# Patient Record
Sex: Male | Born: 1970 | Race: White | Hispanic: No | Smoking: Current some day smoker
Health system: Southern US, Community
[De-identification: ages and names within clinical notes are randomized; demographics above are authoritative.]

## PROBLEM LIST (undated history)

## (undated) HISTORY — PX: OTHER SURGICAL HISTORY: SHX169

## (undated) HISTORY — PX: FRACTURE SURGERY: SHX138

---

## 2014-04-24 ENCOUNTER — Encounter (HOSPITAL_BASED_OUTPATIENT_CLINIC_OR_DEPARTMENT_OTHER): Payer: Self-pay | Admitting: *Deleted

## 2014-04-24 ENCOUNTER — Inpatient Hospital Stay (HOSPITAL_BASED_OUTPATIENT_CLINIC_OR_DEPARTMENT_OTHER)
Admission: EM | Admit: 2014-04-24 | Discharge: 2014-04-25 | DRG: 988 | Disposition: A | Discharge status: Home / Self Care | Payer: Self-pay | Attending: Internal Medicine | Admitting: Internal Medicine

## 2014-04-24 ENCOUNTER — Emergency Department (HOSPITAL_BASED_OUTPATIENT_CLINIC_OR_DEPARTMENT_OTHER): Payer: Self-pay

## 2014-04-24 DIAGNOSIS — N493 Fournier gangrene: Secondary | ICD-10-CM

## 2014-04-24 DIAGNOSIS — Z7982 Long term (current) use of aspirin: Secondary | ICD-10-CM

## 2014-04-24 DIAGNOSIS — K859 Acute pancreatitis without necrosis or infection, unspecified: Secondary | ICD-10-CM | POA: Diagnosis present

## 2014-04-24 DIAGNOSIS — F17210 Nicotine dependence, cigarettes, uncomplicated: Secondary | ICD-10-CM | POA: Diagnosis present

## 2014-04-24 DIAGNOSIS — E1165 Type 2 diabetes mellitus with hyperglycemia: Secondary | ICD-10-CM

## 2014-04-24 DIAGNOSIS — Z720 Tobacco use: Secondary | ICD-10-CM

## 2014-04-24 DIAGNOSIS — F17200 Nicotine dependence, unspecified, uncomplicated: Secondary | ICD-10-CM

## 2014-04-24 DIAGNOSIS — N492 Inflammatory disorders of scrotum: Principal | ICD-10-CM | POA: Diagnosis present

## 2014-04-24 DIAGNOSIS — T86898 Other complications of other transplanted tissue: Secondary | ICD-10-CM

## 2014-04-24 DIAGNOSIS — L0291 Cutaneous abscess, unspecified: Secondary | ICD-10-CM | POA: Diagnosis present

## 2014-04-24 DIAGNOSIS — Z6834 Body mass index (BMI) 34.0-34.9, adult: Secondary | ICD-10-CM

## 2014-04-24 DIAGNOSIS — IMO0002 Reserved for concepts with insufficient information to code with codable children: Secondary | ICD-10-CM | POA: Insufficient documentation

## 2014-04-24 DIAGNOSIS — R609 Edema, unspecified: Secondary | ICD-10-CM | POA: Diagnosis present

## 2014-04-24 DIAGNOSIS — E871 Hypo-osmolality and hyponatremia: Secondary | ICD-10-CM

## 2014-04-24 DIAGNOSIS — E119 Type 2 diabetes mellitus without complications: Secondary | ICD-10-CM | POA: Diagnosis present

## 2014-04-24 DIAGNOSIS — D696 Thrombocytopenia, unspecified: Secondary | ICD-10-CM | POA: Insufficient documentation

## 2014-04-24 DIAGNOSIS — Z91018 Allergy to other foods: Secondary | ICD-10-CM

## 2014-04-24 DIAGNOSIS — E869 Volume depletion, unspecified: Secondary | ICD-10-CM | POA: Diagnosis present

## 2014-04-24 DIAGNOSIS — L03315 Cellulitis of perineum: Secondary | ICD-10-CM | POA: Diagnosis present

## 2014-04-24 LAB — URINALYSIS, ROUTINE W REFLEX MICROSCOPIC
Bilirubin Urine: NEGATIVE
Glucose, UA: >1000 mg/dL — AB
Hgb urine dipstick: NEGATIVE
KETONES UR: 15 mg/dL — AB
Leukocytes, UA: NEGATIVE
NITRITE: NEGATIVE
Protein, ur: NEGATIVE mg/dL
Specific Gravity, Urine: 1.018 (ref 1.005–1.030)
UROBILINOGEN UA: 1 mg/dL (ref 0.0–1.0)
pH: 7.5 (ref 5.0–8.0)

## 2014-04-24 LAB — CBC WITH DIFFERENTIAL/PLATELET
BASOS PCT: 0 % (ref 0–1)
Basophils Absolute: 0 10*3/uL (ref 0.0–0.1)
Eosinophils Absolute: 0.3 10*3/uL (ref 0.0–0.7)
Eosinophils Relative: 2 % (ref 0–5)
HCT: 47.5 % (ref 39.0–52.0)
Hemoglobin: 15.8 g/dL (ref 13.0–17.0)
Lymphocytes Relative: 20 % (ref 12–46)
Lymphs Abs: 3 10*3/uL (ref 0.7–4.0)
MCH: 27.5 pg (ref 26.0–34.0)
MCHC: 33.3 g/dL (ref 30.0–36.0)
MCV: 82.6 fL (ref 78.0–100.0)
MONOS PCT: 9 % (ref 3–12)
Monocytes Absolute: 1.2 10*3/uL — ABNORMAL HIGH (ref 0.1–1.0)
Neutro Abs: 10 10*3/uL — ABNORMAL HIGH (ref 1.7–7.7)
Neutrophils Relative %: 69 % (ref 43–77)
Platelets: 142 10*3/uL — ABNORMAL LOW (ref 150–400)
RBC: 5.75 MIL/uL (ref 4.22–5.81)
RDW: 13.9 % (ref 11.5–15.5)
WBC: 14.5 10*3/uL — AB (ref 4.0–10.5)

## 2014-04-24 LAB — GLUCOSE, CAPILLARY
GLUCOSE-CAPILLARY: 165 mg/dL — AB (ref 70–99)
GLUCOSE-CAPILLARY: 187 mg/dL — AB (ref 70–99)
Glucose-Capillary: 168 mg/dL — ABNORMAL HIGH (ref 70–99)

## 2014-04-24 LAB — URINE MICROSCOPIC-ADD ON

## 2014-04-24 LAB — BASIC METABOLIC PANEL
Anion gap: 5 (ref 5–15)
BUN: 11 mg/dL (ref 6–23)
CO2: 25 mmol/L (ref 19–32)
Calcium: 8.1 mg/dL — ABNORMAL LOW (ref 8.4–10.5)
Chloride: 103 mmol/L (ref 96–112)
Creatinine, Ser: 0.76 mg/dL (ref 0.50–1.35)
GFR calc Af Amer: >90 mL/min (ref >90)
GFR calc non Af Amer: >90 mL/min (ref >90)
Glucose, Bld: 253 mg/dL — ABNORMAL HIGH (ref 70–99)
Potassium: 3.9 mmol/L (ref 3.5–5.1)
SODIUM: 133 mmol/L — AB (ref 135–145)

## 2014-04-24 LAB — CBG MONITORING, ED: Glucose-Capillary: 240 mg/dL — ABNORMAL HIGH (ref 70–99)

## 2014-04-24 MED ORDER — INSULIN ASPART PROT & ASPART (70-30 MIX) 100 UNIT/ML ~~LOC~~ SUSP
20.0000 [IU] | Freq: Two times a day (BID) | SUBCUTANEOUS | Status: DC
  Administered 2014-04-24 – 2014-04-25 (×2): 20 [IU] via SUBCUTANEOUS
  Filled 2014-04-24 (×2): qty 10

## 2014-04-24 MED ORDER — INSULIN ASPART 100 UNIT/ML ~~LOC~~ SOLN: 0.0000 [IU] | Freq: Every day | SUBCUTANEOUS | Status: DC

## 2014-04-24 MED ORDER — SODIUM CHLORIDE 0.9 % IV SOLN
Freq: Once | INTRAVENOUS | Status: AC
  Administered 2014-04-24: 10 mL/h via INTRAVENOUS

## 2014-04-24 MED ORDER — METRONIDAZOLE IN NACL 5-0.79 MG/ML-% IV SOLN
500.0000 mg | Freq: Once | INTRAVENOUS | Status: DC
Start: 2014-04-24 — End: 2014-04-24

## 2014-04-24 MED ORDER — TETANUS-DIPHTH-ACELL PERTUSSIS 5-2.5-18.5 LF-MCG/0.5 IM SUSP
0.5000 mL | Freq: Once | INTRAMUSCULAR | Status: AC
  Administered 2014-04-24: 0.5 mL via INTRAMUSCULAR
  Filled 2014-04-24: qty 0.5

## 2014-04-24 MED ORDER — VANCOMYCIN HCL IN DEXTROSE 1-5 GM/200ML-% IV SOLN
1000.0000 mg | Freq: Once | INTRAVENOUS | Status: AC
  Administered 2014-04-24: 1000 mg via INTRAVENOUS
  Filled 2014-04-24: qty 200

## 2014-04-24 MED ORDER — SODIUM CHLORIDE 0.9 % IV BOLUS (SEPSIS)
1000.0000 mL | Freq: Once | INTRAVENOUS | Status: AC
  Administered 2014-04-24: 1000 mL via INTRAVENOUS

## 2014-04-24 MED ORDER — LIDOCAINE HCL 1 % IJ SOLN
INTRAMUSCULAR | Status: AC
  Administered 2014-04-24: 10:00:00 via INTRAMUSCULAR
  Filled 2014-04-24: qty 20

## 2014-04-24 MED ORDER — ENOXAPARIN SODIUM 40 MG/0.4ML ~~LOC~~ SOLN
40.0000 mg | SUBCUTANEOUS | Status: DC
Start: 2014-04-24 — End: 2014-04-25
  Administered 2014-04-24: 40 mg via SUBCUTANEOUS
  Filled 2014-04-24 (×2): qty 0.4

## 2014-04-24 MED ORDER — ACETAMINOPHEN 650 MG RE SUPP
650.0000 mg | Freq: Four times a day (QID) | RECTAL | Status: DC | PRN
Start: 2014-04-24 — End: 2014-04-25

## 2014-04-24 MED ORDER — FENTANYL CITRATE 0.05 MG/ML IJ SOLN
50.0000 ug | Freq: Once | INTRAMUSCULAR | Status: AC
  Administered 2014-04-24: 50 ug via INTRAVENOUS
  Filled 2014-04-24: qty 2

## 2014-04-24 MED ORDER — IOHEXOL 300 MG/ML  SOLN
100.0000 mL | Freq: Once | INTRAMUSCULAR | Status: AC | PRN
  Administered 2014-04-24: 100 mL via INTRAVENOUS

## 2014-04-24 MED ORDER — ACETAMINOPHEN 325 MG PO TABS
650.0000 mg | ORAL_TABLET | Freq: Four times a day (QID) | ORAL | Status: DC | PRN
Start: 2014-04-24 — End: 2014-04-25

## 2014-04-24 MED ORDER — INSULIN ASPART 100 UNIT/ML ~~LOC~~ SOLN
0.0000 [IU] | Freq: Three times a day (TID) | SUBCUTANEOUS | Status: DC
  Administered 2014-04-24 – 2014-04-25 (×2): 3 [IU] via SUBCUTANEOUS

## 2014-04-24 MED ORDER — CLINDAMYCIN PHOSPHATE 600 MG/50ML IV SOLN
600.0000 mg | Freq: Once | INTRAVENOUS | Status: AC
  Administered 2014-04-24: 600 mg via INTRAVENOUS
  Filled 2014-04-24: qty 50

## 2014-04-24 MED ORDER — PIPERACILLIN-TAZOBACTAM 3.375 G IVPB
3.3750 g | Freq: Three times a day (TID) | INTRAVENOUS | Status: DC
  Administered 2014-04-24 – 2014-04-25 (×3): 3.375 g via INTRAVENOUS
  Filled 2014-04-24 (×4): qty 50

## 2014-04-24 MED ORDER — SODIUM CHLORIDE 0.9 % IV SOLN
INTRAVENOUS | Status: DC
  Administered 2014-04-24: 125 mL/h via INTRAVENOUS

## 2014-04-24 MED ORDER — PIPERACILLIN-TAZOBACTAM 3.375 G IVPB 30 MIN: 3.3750 g | Freq: Three times a day (TID) | INTRAVENOUS | Status: DC

## 2014-04-24 MED ORDER — HYDROCODONE-ACETAMINOPHEN 5-325 MG PO TABS
1.0000 | ORAL_TABLET | ORAL | Status: DC | PRN
  Administered 2014-04-25 (×2): 1 via ORAL
  Filled 2014-04-24 (×2): qty 1

## 2014-04-24 MED ORDER — PIPERACILLIN-TAZOBACTAM 3.375 G IVPB 30 MIN
3.3750 g | Freq: Once | INTRAVENOUS | Status: AC
  Administered 2014-04-24: 3.375 g via INTRAVENOUS
  Filled 2014-04-24 (×2): qty 50

## 2014-04-24 MED ORDER — VANCOMYCIN HCL IN DEXTROSE 1-5 GM/200ML-% IV SOLN
1000.0000 mg | Freq: Three times a day (TID) | INTRAVENOUS | Status: DC
Start: 2014-04-24 — End: 2014-04-25
  Administered 2014-04-24 – 2014-04-25 (×3): 1000 mg via INTRAVENOUS
  Filled 2014-04-24 (×4): qty 200

## 2014-04-24 NOTE — ED Notes (Signed)
Dr. Nicanor AlconPalumbo at [facility], chaperone present for exam.

## 2014-04-24 NOTE — Progress Notes (Signed)
Spoke with Dr. Nicanor AlconPalumbo about transfer of pt [facility]  43y/o male with hx of DM2 who claims no MD every told him presented to ED with scrotal swelling and pain with drainage.  Pt had A1C on 09/25/13 of 14.1.  CT of pelvis showed scrotal abscess.  Dr. Nicanor AlconPalumbo already spoke with urology, Dr. Alfredia Fergusonttelein, who will consult and plans to take pt to surgery.  Pt given vanc, zosyn, and clinda in ED.  Vitals stable without hypotension.  Pt accepted for med-surg bed.  DTat

## 2014-04-24 NOTE — ED Notes (Signed)
No changes, pt to CT, alert, NAD, calm.

## 2014-04-24 NOTE — H&P (Signed)
History and Physical  Ronald BrownsDouglas Calabretta ZOX:[id number] DOB: 03/13/1970 DOA: 04/24/2014   PCP: No PCP Per Patient   Chief Complaint: scrotal pain and edema  HPI:  44 year old male with a history of diabetes mellitus presented with 4 day history of increasing pain and edema of his scrotum. The patient states that it started out as a small bump 4 days prior to this admission and has significantly increased in size to the point where he was unable to sleep or move without discomfort. He denies any previous abscess in his perineum although he has had a back abscess in the past. He denied fevers, chills, chest pain, shortness breath, nausea, vomiting, diarrhea. The patient states that he was unaware of a diagnosis of diabetes mellitus. However, review of the medical record in [facility]  shows that he had a hemoglobin A1c of 14.1 on 09/25/2013. The patient has not been on any medications for his diabetes. He is still able to void spontaneously without difficulty.   In the emergency department, CT of the pelvis revealed a 2.2 cm scrotal abscess. Sodium was 133. WBC was 14.5. Urinalysis was negative for pyuria. The patient was afebrile and hemodynamically stable. Urology, Dr. Vernie Ammonsttelin was consulted and drained the abscess. The patient received vancomycin, clindamycin and Zosyn In the emergency department.  Assessment/Plan: Scrotal Abscess -Continue vancomycin and Zosyn  pending culture data  -appreciate Dr. Vernie Ammonsttelin -Follow up culture data from the incision and drainage and adjust abx accordingly -Local wound care per Dr. Vernie Ammonsttelin -HIV antibody Diabetes mellitus type 2  -Recheck hemoglobin A1c  -09/25/2013 hemoglobin A1c 14.1  -As the patient does not have a payor source, start 70/30 insulin--20 units bid -Check CBGs and adjust her 70/30 insulin accordingly  -Check morning lipid panel  -nutrition consult -DM coordinator consult for education Tobacco abuse  -Tobacco cessation discussed  Morbid  obesity -BMI 34.9 Hyponatremia -likely due to volume depletion and pseudo elevation from high CBG -give one liter NS      History reviewed. No pertinent past medical history. Past Surgical History  Procedure Laterality Date  . Arm surgery     Social History:  reports that he has been smoking.  He does not have any smokeless tobacco history on file. He reports that he does not drink alcohol or use illicit drugs.   History reviewed. No pertinent family history.   Allergies  Allergen Reactions  . Asparagus Anaphylaxis  . Broccoli [Brassica Oleracea Italica] Anaphylaxis  . Other Other (See Comments)    Cats-- sneezing, eyes watering.       Prior to Admission medications   Medication Sig Start Date End Date Taking? Authorizing Provider  aspirin-acetaminophen-caffeine (EXCEDRIN MIGRAINE) [phone] MG per tablet Take 2 tablets by mouth every 6 (six) hours as needed for headache.   Yes Historical Provider, MD    Review of Systems:  Constitutional:  No weight loss, night sweats, Fevers, chills, fatigue.  Head&Eyes: No headache.  No vision loss.  No eye pain or scotoma ENT:  No Difficulty swallowing,Tooth/dental problems,Sore throat,   Cardio-vascular:  No chest pain, Orthopnea, PND, swelling in lower extremities,  dizziness, palpitations  GI:  No  abdominal pain, nausea, vomiting, diarrhea, loss of appetite, hematochezia, melena, heartburn, indigestion, Resp:  No shortness of breath with exertion or at rest. No cough. No coughing up of blood .No wheezing.No chest wall deformity  Skin:  no rash or lesions.  GU:  no dysuria, change in color of urine, no urgency or  frequency. No flank pain. Positive scrotal pain and swelling Musculoskeletal:  No joint pain or swelling. No decreased range of motion. No back pain.  Psych:  No change in mood or affect. No depression or anxiety. Neurologic: No headache, no dysesthesia, no focal weakness, no vision loss. No  syncope  Physical Exam: Filed Vitals:   04/24/14 0630 04/24/14 0700 04/24/14 0746 04/24/14 0931  BP: 124/76 137/80 126/75 132/88  Pulse: 78 90 88 92  Temp:    98.6 F (37 C)  TempSrc:    Oral  Resp:   18 18  Height:      Weight:      SpO2: 98% 94% 98% 97%   General:  A&O x 3, NAD, nontoxic, pleasant/cooperative Head/Eye: No conjunctival hemorrhage, no icterus, [place]/AT, No nystagmus ENT:  No icterus,  No thrush, good dentition, no pharyngeal exudate Neck:  No masses, no lymphadenpathy, no bruits CV:  RRR, no rub, no gallop, no S3 Lung:  CTAB, good air movement, no wheeze, no rhonchi Abdomen: soft/NT, +BS, nondistended, no peritoneal signs Ext: No cyanosis, No rashes, No petechiae, No lymphangitis, No edema GU- no phimosis, posterior scrotum with approximately 6 cm area of induration. Center of the wound is packed. No crepitance. No necrosis.   Labs on Admission:  Basic Metabolic Panel:  Recent Labs Lab 04/24/14 0500  NA 133*  K 3.9  CL 103  CO2 25  GLUCOSE 253*  BUN 11  CREATININE 0.76  CALCIUM 8.1*   Liver Function Tests: No results for input(s): AST, ALT, ALKPHOS, BILITOT, PROT, ALBUMIN in the last 168 hours. No results for input(s): LIPASE, AMYLASE in the last 168 hours. No results for input(s): AMMONIA in the last 168 hours. CBC:  Recent Labs Lab 04/24/14 0500  WBC 14.5*  NEUTROABS 10.0*  HGB 15.8  HCT 47.5  MCV 82.6  PLT 142*   Cardiac Enzymes: No results for input(s): CKTOTAL, CKMB, CKMBINDEX, TROPONINI in the last 168 hours. BNP: Invalid input(s): POCBNP CBG:  Recent Labs Lab 04/24/14 0447  GLUCAP 240*    Radiological Exams on Admission: Ct Pelvis W Contrast  04/24/2014   CLINICAL DATA:  Mid scrotal abscess, first noted 2 state  EXAM: CT PELVIS WITH CONTRAST  TECHNIQUE: Multidetector CT imaging of the pelvis was performed using the standard protocol following the bolus administration of intravenous contrast.  CONTRAST:  OMNIPAQUE IOHEXOL  300 MG/ML  SOLN  COMPARISON:  None currently available  FINDINGS: The scrotal wall is diffusely edematous and thickened, with an immediately subcutaneous 22 mm abscess along the posterior midline scrotum. There is no subcutaneous gas. No evidence of hydrocele or intrascrotal infection. No perineal or supralevator inflammation.  The pelvic viscera are unremarkable.  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  IMPRESSION: 1. 22 mm scrotal abscess with surrounding cellulitis. 2. No subcutaneous gas. 3. Osteopoikilosis.   Electronically Signed   By: Marnee Spring M.D.   On: 04/24/2014 06:57        Time spent:60 minutes Code Status:   FULL Family Communication: No  Family at bedside   Jaz Laningham, DO  [facility] Pager [phone]  If 7PM-7AM, please contact night-coverage [email or web address] Password [email or web address] 04/24/2014, 2:10 PM

## 2014-04-24 NOTE — ED Notes (Signed)
Pt c/o abscess under his scrotum since Tuesday. Pt sts it has gotten considerably worse in the past 24 hours.

## 2014-04-24 NOTE — ED Provider Notes (Signed)
CSN: [id number]     Arrival date & time 04/24/14  0249 History   First MD Initiated Contact with Patient 04/24/14 0330     Chief Complaint  Patient presents with  . Abscess     (Consider location/radiation/quality/duration/timing/severity/associated sxs/prior Treatment) Patient is a 44 y.o. male presenting with abscess. The history is provided by the patient.  Abscess Location:  Ano-genital Ano-genital abscess location:  Scrotum Abscess quality: induration, painful and redness   Duration:  5 days Progression:  Worsening Pain details:    Severity:  Moderate   Timing:  Constant   Progression:  Worsening Chronicity:  New Context: diabetes   Relieved by:  Nothing Worsened by:  Nothing tried Ineffective treatments:  None tried Associated symptoms: no fever   Risk factors: no hx of MRSA   Markedly worse with swelling in the past 24 hours.    History reviewed. No pertinent past medical history. Past Surgical History  Procedure Laterality Date  . Arm surgery     No family history on file. History  Substance Use Topics  . Smoking status: Current Some Day Smoker  . Smokeless tobacco: Not on file  . Alcohol Use: No    Review of Systems  Constitutional: Negative for fever.  All other systems reviewed and are negative.     Allergies  Review of patient's allergies indicates no known allergies.  Home Medications   Prior to Admission medications   Not on File   BP 149/82 mmHg  Pulse 102  Temp(Src) 98.2 F (36.8 C) (Oral)  Resp 18  Ht 6' (1.829 m)  Wt 257 lb (116.574 kg)  BMI 34.85 kg/m2  SpO2 95% Physical Exam  Constitutional: He is oriented to person, place, and time. He appears well-developed and well-nourished. No distress.  HENT:  Head: Normocephalic and atraumatic.  Mouth/Throat: Oropharynx is clear and moist.  Eyes: Conjunctivae are normal. Pupils are equal, round, and reactive to light.  Neck: Normal range of motion. Neck supple.  Cardiovascular:  Regular rhythm.  Tachycardia present.   Pulmonary/Chest: Breath sounds normal. No respiratory distress. He has no wheezes. He has no rales.  Abdominal: Soft. Bowel sounds are normal. There is no tenderness. There is no rebound and no guarding.  Genitourinary:     Chaperone present  Musculoskeletal: Normal range of motion.  Neurological: He is alert and oriented to person, place, and time.  Psychiatric: He has a normal mood and affect.    ED Course  Procedures (including critical care time) Labs Review Labs Reviewed  CBC WITH DIFFERENTIAL/PLATELET - Abnormal; Notable for the following:    WBC 14.5 (*)    Platelets 142 (*)    Neutro Abs 10.0 (*)    Monocytes Absolute 1.2 (*)    All other components within normal limits  BASIC METABOLIC PANEL - Abnormal; Notable for the following:    Sodium 133 (*)    Glucose, Bld 253 (*)    Calcium 8.1 (*)    All other components within normal limits  URINALYSIS, ROUTINE W REFLEX MICROSCOPIC - Abnormal; Notable for the following:    APPearance TURBID (*)    Glucose, UA >1000 (*)    Ketones, ur 15 (*)    All other components within normal limits  CBG MONITORING, ED - Abnormal; Notable for the following:    Glucose-Capillary 240 (*)    All other components within normal limits  URINE MICROSCOPIC-ADD ON    Imaging Review No results found.   EKG Interpretation None  MDM   Final diagnoses:  Scrotal infection    Case d/w Dr. Vernie Ammons of urology by phone, EDP stated no air in the scrotal sac.  Symptoms consistent Fourniers, please admit to medicine will see patient   Medications  clindamycin (CLEOCIN) IVPB 600 mg (not administered)  fentaNYL (SUBLIMAZE) injection 50 mcg (not administered)  sodium chloride 0.9 % bolus 1,000 mL (not administered)  Tdap (BOOSTRIX) injection 0.5 mL (0.5 mLs Intramuscular Given 04/24/14 0520)  vancomycin (VANCOCIN) IVPB 1000 mg/200 mL premix (0 mg Intravenous Stopped 04/24/14 0624)    piperacillin-tazobactam (ZOSYN) IVPB 3.375 g (0 g Intravenous Stopped 04/24/14 0720)  0.9 %  sodium chloride infusion (10 mL/hr Intravenous New Bag/Given 04/24/14 0517)  iohexol (OMNIPAQUE) 300 MG/ML solution 100 mL (100 mLs Intravenous Contrast Given 04/24/14 0552)   Results for orders placed or performed during the hospital encounter of 04/24/14  CBC with Differential/Platelet  Result Value Ref Range   WBC 14.5 (H) 4.0 - 10.5 K/uL   RBC 5.75 4.22 - 5.81 MIL/uL   Hemoglobin 15.8 13.0 - 17.0 g/dL   HCT 40.9 81.1 - 91.4 %   MCV 82.6 78.0 - 100.0 fL   MCH 27.5 26.0 - 34.0 pg   MCHC 33.3 30.0 - 36.0 g/dL   RDW 78.2 95.6 - 21.3 %   Platelets 142 (L) 150 - 400 K/uL   Neutrophils Relative % 69 43 - 77 %   Neutro Abs 10.0 (H) 1.7 - 7.7 K/uL   Lymphocytes Relative 20 12 - 46 %   Lymphs Abs 3.0 0.7 - 4.0 K/uL   Monocytes Relative 9 3 - 12 %   Monocytes Absolute 1.2 (H) 0.1 - 1.0 K/uL   Eosinophils Relative 2 0 - 5 %   Eosinophils Absolute 0.3 0.0 - 0.7 K/uL   Basophils Relative 0 0 - 1 %   Basophils Absolute 0.0 0.0 - 0.1 K/uL  Basic metabolic panel  Result Value Ref Range   Sodium 133 (L) 135 - 145 mmol/L   Potassium 3.9 3.5 - 5.1 mmol/L   Chloride 103 96 - 112 mmol/L   CO2 25 19 - 32 mmol/L   Glucose, Bld 253 (H) 70 - 99 mg/dL   BUN 11 6 - 23 mg/dL   Creatinine, Ser 0.86 0.50 - 1.35 mg/dL   Calcium 8.1 (L) 8.4 - 10.5 mg/dL   GFR calc non Af Amer >90 >90 mL/min   GFR calc Af Amer >90 >90 mL/min   Anion gap 5 5 - 15  Urinalysis, Routine w reflex microscopic  Result Value Ref Range   Color, Urine YELLOW YELLOW   APPearance TURBID (A) CLEAR   Specific Gravity, Urine 1.018 1.005 - 1.030   pH 7.5 5.0 - 8.0   Glucose, UA >1000 (A) NEGATIVE mg/dL   Hgb urine dipstick NEGATIVE NEGATIVE   Bilirubin Urine NEGATIVE NEGATIVE   Ketones, ur 15 (A) NEGATIVE mg/dL   Protein, ur NEGATIVE NEGATIVE mg/dL   Urobilinogen, UA 1.0 0.0 - 1.0 mg/dL   Nitrite NEGATIVE NEGATIVE   Leukocytes, UA NEGATIVE  NEGATIVE  Urine microscopic-add on  Result Value Ref Range   Squamous Epithelial / LPF RARE RARE   WBC, UA 0-2 <3 WBC/hpf   RBC / HPF 0-2 <3 RBC/hpf   Urine-Other AMORPHOUS URATES/PHOSPHATES   POC CBG, ED  Result Value Ref Range   Glucose-Capillary 240 (H) 70 - 99 mg/dL   Ct Pelvis W Contrast  04/24/2014   CLINICAL DATA:  Mid scrotal abscess, first  noted 2 state  EXAM: CT PELVIS WITH CONTRAST  TECHNIQUE: Multidetector CT imaging of the pelvis was performed using the standard protocol following the bolus administration of intravenous contrast.  CONTRAST:  OMNIPAQUE IOHEXOL 300 MG/ML  SOLN  COMPARISON:  None currently available  FINDINGS: The scrotal wall is diffusely edematous and thickened, with an immediately subcutaneous 22 mm abscess along the posterior midline scrotum. There is no subcutaneous gas. No evidence of hydrocele or intrascrotal infection. No perineal or supralevator inflammation.  The pelvic viscera are unremarkable.  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  IMPRESSION: 1. 22 mm scrotal abscess with surrounding cellulitis. 2. No subcutaneous gas. 3. Osteopoikilosis.   Electronically Signed   By: Marnee Spring M.D.   On: 04/24/2014 06:57    MDM Reviewed: previous chart, nursing note and vitals (via care everywhere, HbA1 c in August was 14.1) Interpretation: labs and CT scan (elevated WBC and one time glucose > 220 making patient diabetic.  Cellulitis of scrotum by me) Consults: admitting MD and urology    CRITICAL CARE Performed by: Jasmine Awe Total critical care time: 31 minutes Critical care time was exclusive of separately billable procedures and treating other patients. Critical care was necessary to treat or prevent imminent or life-threatening deterioration. Critical care was time spent personally by me on the following activities: development of treatment plan with patient and/or surrogate as well as  nursing, discussions with consultants, evaluation of patient's response to treatment, examination of patient, obtaining history from patient or surrogate, ordering and performing treatments and interventions, ordering and review of laboratory studies, ordering and review of radiographic studies, pulse oximetry and re-evaluation of patient's condition.   Jasmine Awe, MD 04/24/14 [phone]

## 2014-04-24 NOTE — Consult Note (Signed)
Urology Consult  CC: Referring physician:  Dr. Arbutus Leas Reason for referral: Scrotal abscess  History of Present Illness: Ronald Abbott is a 44 year old male who is seen in hospital consultation for a scrotal abscess. He told me that 4 days ago he felt some slight discomfort beneath his scrotum and the following day felt a small bump in that location. The day after that he said the bump had increased in size and then yesterday it had increased significantly with an increase in pain that he described as 6-7/10. He said he was unable to sleep because of the discomfort and concern about the increasing size of his scrotum and went to the emergency room where he was examined and found to have an area that was described as a dark eschar associated with what appeared to be an obvious abscess and associated scrotal swelling and erythema. A CT scan was obtained which revealed no evidence of subcutaneous gas and what appeared to be a well-circumscribed abscess about 2 cm in size at the base of the scrotum in the midline with no apparent involvement of the testicles. He was also found to have markedly elevated serum glucose and he reports he has never been a diabetic in the past and has been tested on a yearly basis. His urine did not appear infected and his white count was 14.5. He set upon arriving to his hospital room he had a great deal of drainage from the scrotal region and this resulted in some improvement in his pain.   History reviewed. No pertinent past medical history. Past Surgical History  Procedure Laterality Date  . Arm surgery      Medications:  Prior to Admission:  No prescriptions prior to admission    Allergies: No Known Allergies  History reviewed. No pertinent family history. his father was diabetic however.  Social History:  reports that he has been smoking.  He does not have any smokeless tobacco history on file. He reports that he does not drink alcohol or use illicit drugs.  Review of  Systems: Pertinent items are noted in HPI. A comprehensive review of systems was negative except as noted above.  Physical Exam:  Vital signs in last 24 hours: Temp:  [98.2 F (36.8 Abbott)] 98.2 F (36.8 Abbott) (03/05 0258) Pulse Rate:  [78-102] 88 (03/05 0746) Resp:  [18] 18 (03/05 0746) BP: (124-149)/(73-82) 126/75 mmHg (03/05 0746) SpO2:  [94 %-98 %] 98 % (03/05 0746) Weight:  [116.574 kg (257 lb)] 116.574 kg (257 lb) (03/05 0258) General appearance: alert and appears stated age Head: Normocephalic, without obvious abnormality, atraumatic Eyes: conjunctivae/corneas clear. EOM's intact.  Oropharynx: moist mucous membranes Neck: supple, symmetrical, trachea midline Resp: normal respiratory effort Cardio: regular rate and rhythm Back: symmetric, no curvature. ROM normal. No CVA tenderness. GI: soft, non-tender; bowel sounds normal; no masses,  no organomegaly  Male genitalia: penis: normal male phallus with no lesions or discharge.Testes: bilaterally descended with no masses the scrotal skin is mildly erythematous but not tender to touch and there is no crepitus. There is a well-circumscribed area at the base of the scrotum near the perineal area that is fluctuant.  Extremities: extremities normal, atraumatic, no cyanosis or edema Skin: Skin color normal. No visible rashes or lesions Neurologic: Grossly normal  Laboratory Data:   Recent Labs  04/24/14 0500  WBC 14.5*  HGB 15.8  HCT 47.5   BMET  Recent Labs  04/24/14 0500  NA 133*  K 3.9  CL 103  CO2 25  GLUCOSE 253*  BUN 11  CREATININE 0.76  CALCIUM 8.1*   No results for input(s): LABPT, INR in the last 72 hours. No results for input(s): LABURIN in the last 72 hours. No results found for this or any previous visit. Creatinine:  Recent Labs  04/24/14 0500  CREATININE 0.76    Imaging: Ct Pelvis W Contrast  04/24/2014   CLINICAL DATA:  Mid scrotal abscess, first noted 2 state  EXAM: CT PELVIS WITH CONTRAST   TECHNIQUE: Multidetector CT imaging of the pelvis was performed using the standard protocol following the bolus administration of intravenous contrast.  CONTRAST:  OMNIPAQUE IOHEXOL 300 MG/ML  SOLN  COMPARISON:  None currently available  FINDINGS: The scrotal wall is diffusely edematous and thickened, with an immediately subcutaneous 22 mm abscess along the posterior midline scrotum. There is no subcutaneous gas. No evidence of hydrocele or intrascrotal infection. No perineal or supralevator inflammation.  The pelvic viscera are unremarkable.  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  IMPRESSION: 1. 22 mm scrotal abscess with surrounding cellulitis. 2. No subcutaneous gas. 3. Osteopoikilosis.   Electronically Signed   By: Marnee Spring M.D.   On: 04/24/2014 06:57   I independently reviewed his CT scan images as noted above.  Procedure: I went over the planned procedure with the patient in detail describing the incision used, the operation in detail and the risks and complications as well as probability of success and anticipated postoperative course. He gave full consent.   Procedure: Incision and drainage of scrotal abscess.  Surgeon: Vernie Ammons  Anesthesia: 1% lidocaine  Specimens: Aerobic and anaerobic cultures from abscess cavity.  Blood loss: Minimal  Complications: None  Using sterile technique I's prepped the scrotum and the area of the abscess with Betadine. I then injected 1% plain lidocaine in the subcutaneous tissue. A midline incision was then made and I then obtained aerobic and anaerobic cultures from the abscess pocket.  I then used hemostats to break up any loculations and then irrigated the wound copiously with sterile saline. The wound was then packed with quarter-inch iodoform gauze and sterile gauze dressing was then applied as well as an ABD and mesh briefs. The patient tolerated the procedure well with no  complication.  Impression/Assessment: Scrotal abscess without evidence of Fournier's gangrene  -  he had a small scrotal abscess with associated scrotal and perineal cellulitis. There was no evidence of crepitus or other physical findings suggestive of a more extensive infection such as Fournier's gangrene. The CT scan also did not reveal any findings to suggest this but rather an isolated abscess only. The patient appears to be a diabetic which is a new diagnosis. His abscess has now been drained. If it were not for the new diagnosis of diabetes I think he would probably be able to be sent home and he told me that in fact he did not want to be in the hospital but would prefer to be discharged after drainage of his abscess. I told him that I would leave that up to Dr. Arbutus Leas but I thought an overnight stay at the least might be necessary. I have discussed with the patient wound care and he understands this. His girlfriend is going to help him with wound care and his wound will need to heal by second intent. Drainage of an abscess alone in the majority of cases is curative without actual need for antibiotics although antibiotics are always used and I would recommend  discharge on broad spectrum oral antibiotics such as Septra DS which also would cover for MRSA. I will check him in the morning and he will follow-up with me as an outpatient.    Plan:   1. Abscess cultured. 2. Abscess drained and packed. 3. He will follow-up with me as an outpatient next week.    Ronald Abbott 04/24/2014, 9:09 AM

## 2014-04-24 NOTE — ED Notes (Addendum)
Pt alert, NAD, calm, interactive. C/o mid scrotal abscess. First noticed Tuesday. (denies: nvd, urinary sx, fever, dizziness, drainage or fever). Dry red/brown drainage noted.

## 2014-04-24 NOTE — Progress Notes (Signed)
ANTIBIOTIC CONSULT NOTE - INITIAL  Pharmacy Consult for vancomycin Indication: scrotal abscess  Allergies  Allergen Reactions  . Asparagus Anaphylaxis  . Broccoli [Brassica Oleracea Italica] Anaphylaxis  . Other Other (See Comments)    Cats-- sneezing, eyes watering.     Patient Measurements: Height: 6' (182.9 cm) Weight: 257 lb (116.574 kg) IBW/kg (Calculated) : 77.6  Vital Signs: Temp: 98.6 F (37 C) (03/05 1420) Temp Source: Oral (03/05 1420) BP: 120/74 mmHg (03/05 1420) Pulse Rate: 81 (03/05 1420) Intake/Output from previous day: 03/04 0701 - 03/05 0700 In: 300 [I.V.:100; IV Piggyback:200] Out: -  Intake/Output from this shift: Total I/O In: -  Out: 600 [Urine:600]  Labs:  Recent Labs  04/24/14 0500  WBC 14.5*  HGB 15.8  PLT 142*  CREATININE 0.76   Estimated Creatinine Clearance: 158.6 mL/min (by C-G formula based on Cr of 0.76). No results for input(s): VANCOTROUGH, VANCOPEAK, VANCORANDOM, GENTTROUGH, GENTPEAK, GENTRANDOM, TOBRATROUGH, TOBRAPEAK, TOBRARND, AMIKACINPEAK, AMIKACINTROU, AMIKACIN in the last 72 hours.   Microbiology: No results found for this or any previous visit (from the past 720 hour(s)).  Medical History: History reviewed. No pertinent past medical history.  Medications:  Scheduled:  . enoxaparin (LOVENOX) injection  40 mg Subcutaneous Q24H  . insulin aspart  0-15 Units Subcutaneous TID WC  . insulin aspart  0-5 Units Subcutaneous QHS  . insulin aspart protamine- aspart  20 Units Subcutaneous BID WC  . piperacillin-tazobactam (ZOSYN)  IV  3.375 g Intravenous Q8H   Infusions:  . sodium chloride     Assessment: 44 yo presents to ER with scrotal pain and edema. PMH includes DM. Found to have scrotal abscess and given vancomycin, clinda and Zosyn in ER now continuing Zosyn and vancomycin per pharmacy dosing after admitted  3/5 >> vancomycin >> 3/5 >> Zosyn >>   Tmax: afebrile WBCs: 14.5 Renal: SCr 0.76, CrCl N > 100  3/5  abscess:   Goal of Therapy:  Vancomycin trough level 15-20 mcg/ml  Plan:  Vanc 1g x 1 in ER. Start Vancomycin 1g q8 thereafter   Hessie KnowsJustin M Doak Mah, PharmD, BCPS Pager [phone] 04/24/2014 2:32 PM

## 2014-04-25 DIAGNOSIS — IMO0002 Reserved for concepts with insufficient information to code with codable children: Secondary | ICD-10-CM | POA: Insufficient documentation

## 2014-04-25 DIAGNOSIS — D696 Thrombocytopenia, unspecified: Secondary | ICD-10-CM | POA: Insufficient documentation

## 2014-04-25 DIAGNOSIS — N492 Inflammatory disorders of scrotum: Principal | ICD-10-CM

## 2014-04-25 DIAGNOSIS — E1165 Type 2 diabetes mellitus with hyperglycemia: Secondary | ICD-10-CM

## 2014-04-25 LAB — LIPID PANEL
Cholesterol: 151 mg/dL (ref 0–200)
HDL: 24 mg/dL — ABNORMAL LOW (ref >39)
LDL Cholesterol: 103 mg/dL — ABNORMAL HIGH (ref 0–99)
Total CHOL/HDL Ratio: 6.3 RATIO
Triglycerides: 120 mg/dL (ref <150)
VLDL: 24 mg/dL (ref 0–40)

## 2014-04-25 LAB — CBC
HCT: 47 % (ref 39.0–52.0)
Hemoglobin: 15.6 g/dL (ref 13.0–17.0)
MCH: 28 pg (ref 26.0–34.0)
MCHC: 33.2 g/dL (ref 30.0–36.0)
MCV: 84.2 fL (ref 78.0–100.0)
Platelets: 125 10*3/uL — ABNORMAL LOW (ref 150–400)
RBC: 5.58 MIL/uL (ref 4.22–5.81)
RDW: 13.7 % (ref 11.5–15.5)
WBC: 10.2 10*3/uL (ref 4.0–10.5)

## 2014-04-25 LAB — BASIC METABOLIC PANEL
Anion gap: 9 (ref 5–15)
BUN: 9 mg/dL (ref 6–23)
CHLORIDE: 103 mmol/L (ref 96–112)
CO2: 25 mmol/L (ref 19–32)
CREATININE: 0.7 mg/dL (ref 0.50–1.35)
Calcium: 8.2 mg/dL — ABNORMAL LOW (ref 8.4–10.5)
GFR calc Af Amer: >90 mL/min (ref >90)
GFR calc non Af Amer: >90 mL/min (ref >90)
GLUCOSE: 155 mg/dL — AB (ref 70–99)
POTASSIUM: 4 mmol/L (ref 3.5–5.1)
Sodium: 137 mmol/L (ref 135–145)

## 2014-04-25 LAB — HIV ANTIBODY (ROUTINE TESTING W REFLEX): HIV Screen 4th Generation wRfx: NONREACTIVE

## 2014-04-25 LAB — GLUCOSE, CAPILLARY
GLUCOSE-CAPILLARY: 137 mg/dL — AB (ref 70–99)
Glucose-Capillary: 159 mg/dL — ABNORMAL HIGH (ref 70–99)
Glucose-Capillary: 179 mg/dL — ABNORMAL HIGH (ref 70–99)

## 2014-04-25 MED ORDER — INSULIN ASPART PROT & ASPART (70-30 MIX) 100 UNIT/ML ~~LOC~~ SUSP: 20.0000 [IU] | Freq: Two times a day (BID) | SUBCUTANEOUS | Status: AC

## 2014-04-25 MED ORDER — INSULIN ASPART PROT & ASPART (70-30 MIX) 100 UNIT/ML ~~LOC~~ SUSP: 20.0000 [IU] | Freq: Two times a day (BID) | SUBCUTANEOUS | Status: DC

## 2014-04-25 MED ORDER — SULFAMETHOXAZOLE-TRIMETHOPRIM 800-160 MG PO TABS: 1.0000 | ORAL_TABLET | Freq: Two times a day (BID) | ORAL | Status: AC

## 2014-04-25 MED ORDER — ENOXAPARIN SODIUM 60 MG/0.6ML ~~LOC~~ SOLN
60.0000 mg | SUBCUTANEOUS | Status: DC
Start: 2014-04-25 — End: 2014-04-25
  Filled 2014-04-25: qty 0.6

## 2014-04-25 MED ORDER — LIVING WELL WITH DIABETES BOOK
Freq: Once | Status: DC
Start: 2014-04-25 — End: 2014-04-25
  Filled 2014-04-25: qty 1

## 2014-04-25 MED ORDER — INSULIN STARTER KIT- SYRINGES (ENGLISH)
1.0000 | Freq: Once | Status: DC
  Filled 2014-04-25: qty 1

## 2014-04-25 NOTE — Progress Notes (Signed)
  Subjective: Patient reports he is feeling well. He is not having any significant pain.  Objective: Vital signs in last 24 hours: Temp:  [98.5 F (36.9 C)-98.6 F (37 C)] 98.5 F (36.9 C) (03/06 0644) Pulse Rate:  [76-92] 76 (03/06 0644) Resp:  [16-18] 16 (03/06 0644) BP: (120-135)/(74-88) 135/78 mmHg (03/06 0644) SpO2:  [97 %-98 %] 98 % (03/06 0644) Weight:  [121.927 kg (268 lb 12.8 oz)] 121.927 kg (268 lb 12.8 oz) (03/05 2030)  Intake/Output from previous day: 03/05 0701 - 03/06 0700 In: 2880.4 [P.O.:720; I.V.:1660.4; IV Piggyback:500] Out: 2775 [Urine:2775] Intake/Output this shift:    Physical Exam:  Scrotal erythema and swelling has decreased. Packing is in place with minimal drainage.   Lab Results:  Recent Labs  04/24/14 0500 04/25/14 0506  HGB 15.8 15.6  HCT 47.5 47.0   BMET  Recent Labs  04/24/14 0500 04/25/14 0506  NA 133* 137  K 3.9 4.0  CL 103 103  CO2 25 25  GLUCOSE 253* 155*  BUN 11 9  CREATININE 0.76 0.70  CALCIUM 8.1* 8.2*   No results for input(s): LABPT, INR in the last 72 hours. No results for input(s): LABURIN in the last 72 hours. No results found for this or any previous visit.  Studies/Results: Ct Pelvis W Contrast  04/24/2014   CLINICAL DATA:  Mid scrotal abscess, first noted 2 state  EXAM: CT PELVIS WITH CONTRAST  TECHNIQUE: Multidetector CT imaging of the pelvis was performed using the standard protocol following the bolus administration of intravenous contrast.  CONTRAST:  100mL OMNIPAQUE IOHEXOL 300 MG/ML  SOLN  COMPARISON:  None currently available  FINDINGS: The scrotal wall is diffusely edematous and thickened, with an immediately subcutaneous 22 mm abscess along the posterior midline scrotum. There is no subcutaneous gas. No evidence of hydrocele or intrascrotal infection. No perineal or supralevator inflammation.  The pelvic viscera are unremarkable.  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  IMPRESSION: 1. 22 mm scrotal abscess with surrounding cellulitis. 2. No subcutaneous gas. 3. Osteopoikilosis.   Electronically Signed   By: Marnee SpringJonathon  Watts M.D.   On: 04/24/2014 06:57    Assessment/Plan: He is doing well from a urologic standpoint with decreased pain and improvement clinically. In addition his white blood cell count has normalized. He is felt to be ready for discharge home urologic standpoint.  I will follow-up on culture results as an outpatient.  Would recommend discharge on Septra DS to cover for MRSA.  He will follow-up with me next week for wound check.   LOS: 1 day   Maricarmen Braziel C 04/25/2014, 8:56 AM

## 2014-04-25 NOTE — Discharge Instructions (Signed)

## 2014-04-25 NOTE — Progress Notes (Signed)
Inpatient Diabetes Program Recommendations  AACE/ADA: New Consensus Statement on Inpatient Glycemic Control (2013)  Target Ranges:  Prepandial:   less than 140 mg/dL      Peak postprandial:   less than 180 mg/dL (1-2 hours)      Critically ill patients:  140 - 180 mg/dL  This coordinator spoke with bedside RN to discuss basic inpatient DM education resources. Living Well with Diabetes pt ed workbook, DM videos, insulin starter-kit and RD consult have been ordered. Bedside RN will assist pt with viewing the dm videos and review the Living Well booklet and insulin starter kit. RN will teach pt insulin administration and how to monitor CBGs. Recommend the ReliOn meter and strips from [employer] bc can be purchased for approx $16 meter and $9 vial of 50 strips. Thank you  Raoul Pitch BSN, RN,CDE Inpatient Diabetes Coordinator [phone] (team pager)

## 2014-04-25 NOTE — Discharge Summary (Signed)
Reviewed d/c instructions with pt and girlfriend including wound/incision care, follow-up appointments, and precautions.  Pt seen by dietician for additional DM dietary training, initiated by RN during pt's stay.  Provided pt with supplies for dressing changes.  Instructed pt in administering insulin which he performed yesterday and today.  Instructed pt in drawing up insulin which he was able to correctly demonstrate.  Discussed hypo and hyperglycemia s/s and tx and gave pt "Living with Diabetes book" highlighting important portions.  Instructed pt in use of starter kit.  Discussed purchasing glucometer and strips and need to monitor BG as performed in hospital.  Gave pt booklet to record BG levels and take to MD/DM educator on follow-up.  Sent remainder of pt's 70/30 insulin home with him. NT Larkin Ina explained use of glucometer using hospital glucometer as sample including importance of getting appropriate blood drop.  Pt able to teach back instructions.  Answered all of pt's questions and pt verbalized understanding of all topics discussed.  Pt to follow up with OP diabetes education in addition to following up with urologist.  Pt being d/c into care of girlfriend.

## 2014-04-25 NOTE — Discharge Summary (Signed)
Physician Discharge Summary  Ronald Abbott ZOX:[id number] DOB: 1970-02-23 DOA: 04/24/2014  PCP: No PCP Per Patient  Admit date: 04/24/2014 Discharge date: 04/25/2014  Recommendations for Outpatient Follow-up:  1. 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 The patient reported swelling actually started with small bump 4 days prior to this admission. The it has significantly increased in size to the point where he was unable to sleep or move without discomfort.    In the emergency department, CT of the pelvis revealed a 2.2 cm scrotal abscess. Sodium was 133. WBC was 14.5. Urinalysis was negative for pyuria. The patient was afebrile and hemodynamically stable. Urology, Dr. Vernie Ammonsttelin was consulted and drained the abscess. The patient received vancomycin, clindamycin and Zosyn In the emergency department.    Assessment/Plan:  Scrotal Abscess - has received vanco, zosyn and clinda in ED - per GU may continue septra on discharge for 10 days - has had abscess drained by Dr. Vernie Ammonsttelin  Diabetes mellitus type 2, uncontrolled without complications  - 09/25/2013 hemoglobin A1c 14.1 indicating poor glycemic ocntrol - pt discharged on insulin  Tobacco abuse  - Tobacco cessation discussed   Morbid obesity -BMI 34.9 - Advised on diet  Hyponatremia - Secondary to dehydration - Resolved with IV fluids    Signed:  Manson Abbott, [name], MD  [facility] 04/25/2014, 9:21 AM  Pager #: [phone]  Procedures:  None    Consultations:  GU (Dr. [name])  Discharge Exam: Filed Vitals:   04/25/14 0644  BP: 135/78  Pulse: 76  Temp: 98.5 F (36.9 C)  Resp: 16   Filed Vitals:   04/24/14 1420 04/24/14 2030 04/24/14 2205 04/25/14 0644  BP: 120/74  131/76 135/78  Pulse: 81  79 76  Temp: 98.6 F (37 C)  98.6 F (37 C) 98.5 F (36.9 C)  TempSrc: Oral  Oral Oral  Resp: 16  16 16   Height:      Weight:  121.927 kg (268 lb 12.8 oz)    SpO2: 98%  97% 98%    General: Pt is alert, follows commands appropriately, not in acute distress Cardiovascular: Regular rate and rhythm, S1/S2 +, no murmurs Respiratory: Clear to auscultation bilaterally, no wheezing, no crackles, no rhonchi Abdominal: Soft, non tender, non distended, bowel sounds +, no guarding GU: scrotal swelling improved per pt, no tenderness Extremities: no edema, no cyanosis, pulses palpable bilaterally DP and PT Neuro: Grossly nonfocal  Discharge Instructions  Discharge Instructions    Call MD for:  difficulty breathing, headache or visual disturbances    Complete by:  As directed      Call MD for:  redness, tenderness, or signs of infection (pain, swelling, redness, odor or green/yellow discharge around incision site)    Complete by:  As directed      Call MD for:  severe uncontrolled pain    Complete by:  As directed      Diet - low sodium heart healthy    Complete by:  As directed  Discharge instructions    Complete by:  As directed   1. Take septra DS for 10 days on discharge     Increase activity slowly    Complete by:  As directed             Medication List    TAKE these medications        aspirin-acetaminophen-caffeine 250-250-65 MG per tablet  Commonly known as:  EXCEDRIN MIGRAINE  Take 2 tablets by mouth every 6 (six) hours as needed for headache.     insulin aspart protamine- aspart (70-30) 100 UNIT/ML injection  Commonly known as:  NOVOLOG MIX 70/30  Inject 0.2 mLs (20 Units total) into the skin 2  (two) times daily with a meal.     sulfamethoxazole-trimethoprim 800-160 MG per tablet  Commonly known as:  BACTRIM DS,SEPTRA DS  Take 1 tablet by mouth 2 (two) times daily.           Follow-up Information    Follow up with Garnett Farm, MD.   Specialty:  Urology   Why:  For wound re-check next week   Contact information:   [address] [place] [address] [phone]        The results of significant diagnostics from this hospitalization (including imaging, microbiology, ancillary and laboratory) are listed below for reference.    Significant Diagnostic Studies: Ct Pelvis W Contrast 04/24/2014  1. 22 mm scrotal abscess with surrounding cellulitis. 2. No subcutaneous gas. 3. Osteopoikilosis.      Microbiology: No results found for this or any previous visit (from the past 240 hour(s)).   Labs: Basic Metabolic Panel:  Recent Labs Lab 04/24/14 0500 04/25/14 0506  NA 133* 137  K 3.9 4.0  CL 103 103  CO2 25 25  GLUCOSE 253* 155*  BUN 11 9  CREATININE 0.76 0.70  CALCIUM 8.1* 8.2*   Liver Function Tests: No results for input(s): AST, ALT, ALKPHOS, BILITOT, PROT, ALBUMIN in the last 168 hours. No results for input(s): LIPASE, AMYLASE in the last 168 hours. No results for input(s): AMMONIA in the last 168 hours. CBC:  Recent Labs Lab 04/24/14 0500 04/25/14 0506  WBC 14.5* 10.2  NEUTROABS 10.0*  --   HGB 15.8 15.6  HCT 47.5 47.0  MCV 82.6 84.2  PLT 142* 125*   Cardiac Enzymes: No results for input(s): CKTOTAL, CKMB, CKMBINDEX, TROPONINI in the last 168 hours. BNP: BNP (last 3 results) No results for input(s): BNP in the last 8760 hours.  ProBNP (last 3 results) No results for input(s): PROBNP in the last 8760 hours.  CBG:  Recent Labs Lab 04/24/14 1624 04/24/14 1815 04/24/14 2155 04/25/14 0646 04/25/14 0719  GLUCAP 168* 165* 187* 137* 159*    Time coordinating discharge: Over 30 minutes

## 2014-04-25 NOTE — Plan of Care (Signed)
Problem: Food- and Nutrition-Related Knowledge Deficit (NB-1.1) Goal: Nutrition education Formal process to instruct or train a patient/client in a skill or to impart knowledge to help patients/clients voluntarily manage or modify food choices and eating behavior to maintain or improve health. Outcome: Completed/Met Date Met:  04/25/14  RD consulted for nutrition education regarding diabetes.   No results found for: HGBA1C Last HgbA1c: 14.1 in August.  RD provided "Carbohydrate Counting for People with Diabetes" handout from the Academy of [employer] and Dietetics and "Plate Method" visual handout. Discussed different food groups and their effects on blood sugar, emphasizing carbohydrate-containing foods. Provided list of carbohydrates and recommended serving sizes of common foods.  Discussed importance of controlled and consistent carbohydrate intake throughout the day. Provided examples of ways to balance meals/snacks and encouraged intake of high-fiber, whole grain complex carbohydrates. Teach back method used.  Expect good compliance with outpatient education.  Body mass index is 36.45 kg/(m^2). Pt meets criteria for obesity based on current BMI.  Current diet order is CHO modified, patient is consuming approximately 100% of meals at this time. Labs and medications reviewed. No further nutrition interventions warranted at this time.  If additional nutrition issues arise, please re-consult RD.  Ronald Bibles, MS, RD, LDN Pager: [phone] After Hours Pager: [phone]

## 2014-04-26 LAB — HEMOGLOBIN A1C
Hgb A1c MFr Bld: 8.9 % — ABNORMAL HIGH (ref 4.8–5.6)
MEAN PLASMA GLUCOSE: 209 mg/dL

## 2014-04-27 LAB — CULTURE, ROUTINE-ABSCESS
Culture: NO GROWTH
Gram Stain: NONE SEEN
Special Requests: NORMAL

## 2014-04-29 LAB — ANAEROBIC CULTURE: Gram Stain: NONE SEEN

## 2014-12-02 ENCOUNTER — Ambulatory Visit: Payer: BLUE CROSS/BLUE SHIELD | Admitting: *Deleted

## 2015-01-06 ENCOUNTER — Ambulatory Visit: Payer: BLUE CROSS/BLUE SHIELD | Admitting: *Deleted

## 2015-10-25 IMAGING — CT CT PELVIS W/ CM
2 of 6 series · 15 of 46 positions shown, 19 images · IV contrast (omnipaque)
Comparison: None currently available

CLINICAL DATA: Mid scrotal abscess, first noted 2 state

EXAM:
CT PELVIS WITH CONTRAST
TECHNIQUE: Multidetector CT imaging of the pelvis was performed using the
standard protocol following the bolus administration of intravenous
contrast.
CONTRAST:  100mL OMNIPAQUE IOHEXOL 300 MG/ML  SOLN

[Series 2: pelvis 5.0 b31f · axial · 0.91mm/px · z∈[-506,-141]mm · 12 of 87 slices shown, 16 images]
[im 9/87  soft-tissue]
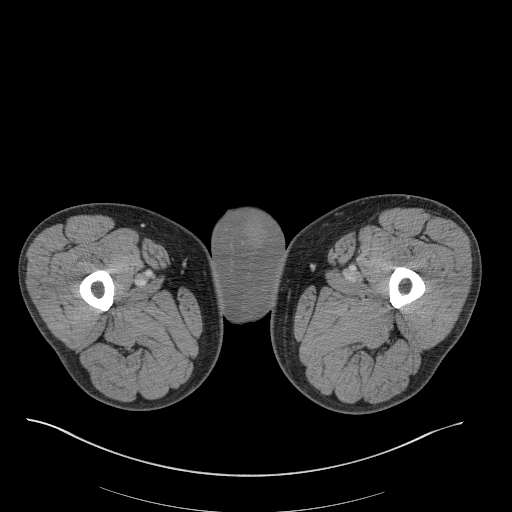
[im 9/87  bone]
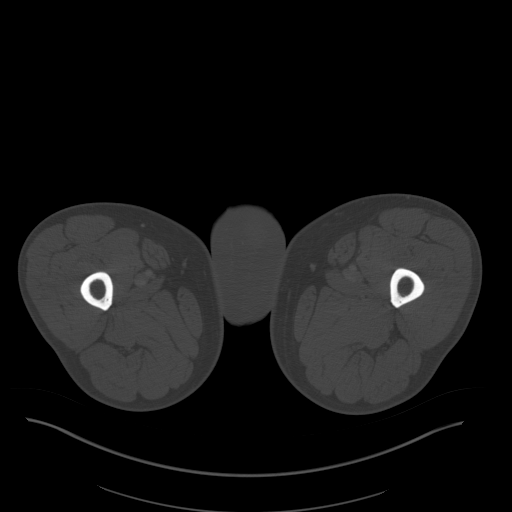
[im 17/87  soft-tissue]
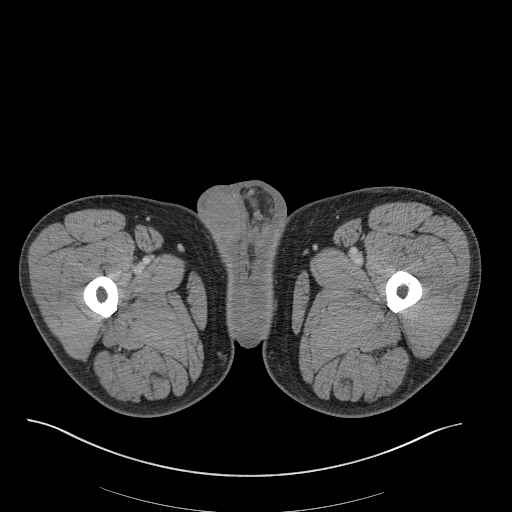
[im 25/87  soft-tissue]
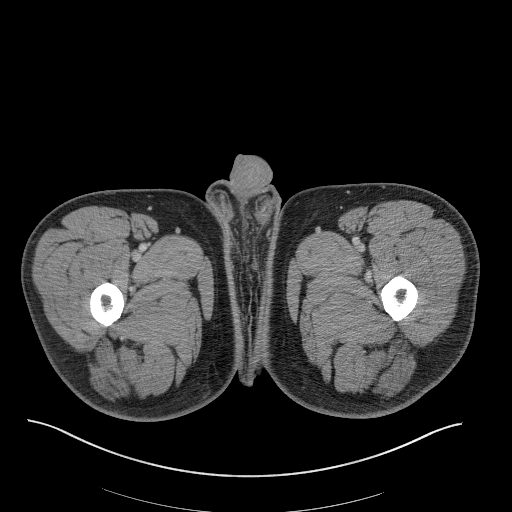
[im 33/87  soft-tissue]
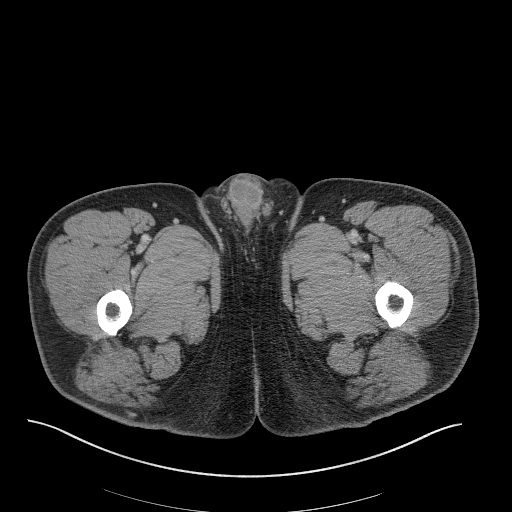
[im 41/87  soft-tissue]
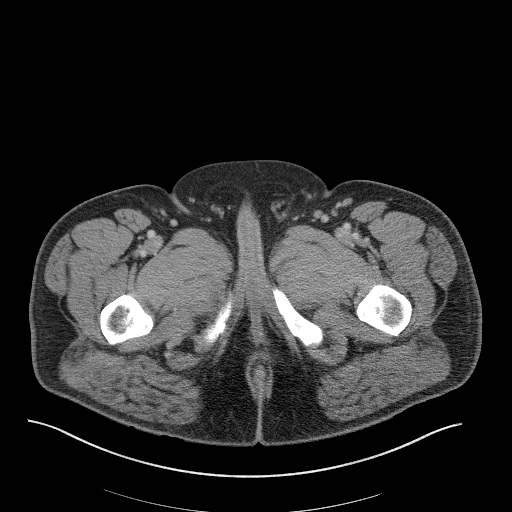
[im 50/87  soft-tissue]
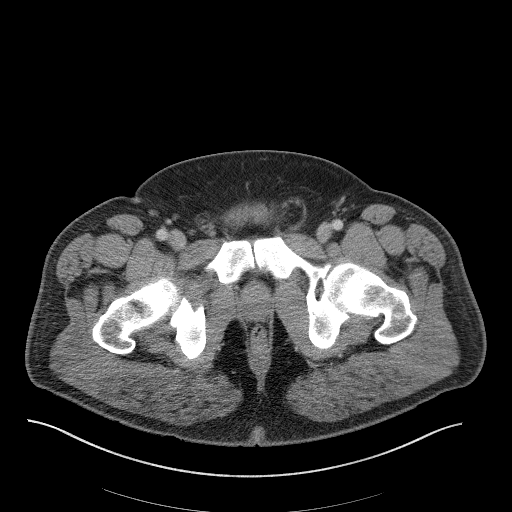
[im 58/87  soft-tissue]
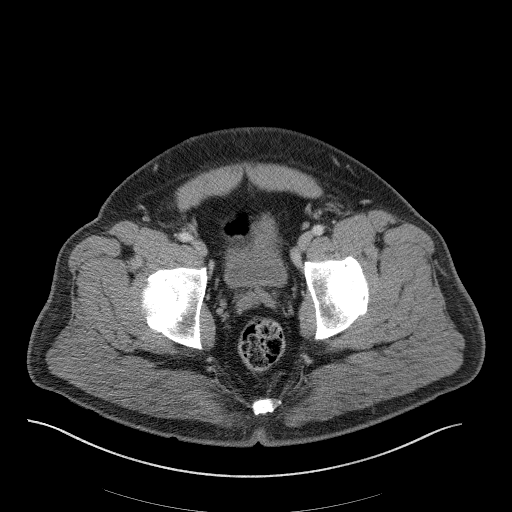
[im 66/87  soft-tissue]
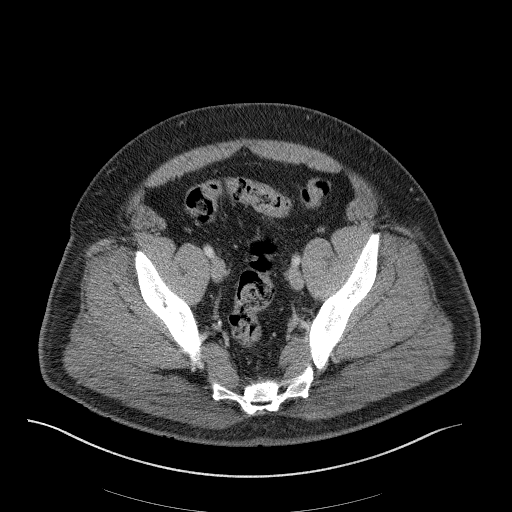
[im 70/87  lung]
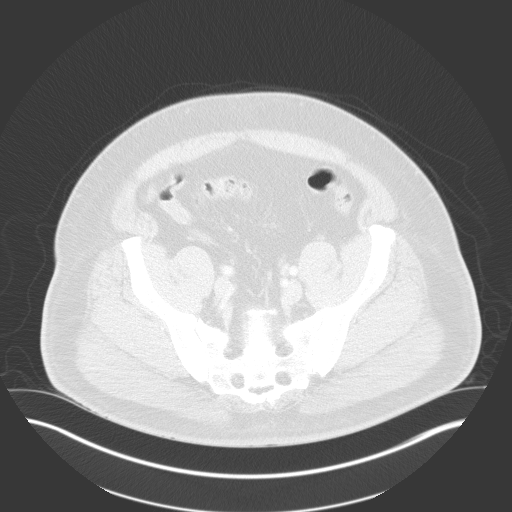
[im 74/87  soft-tissue]
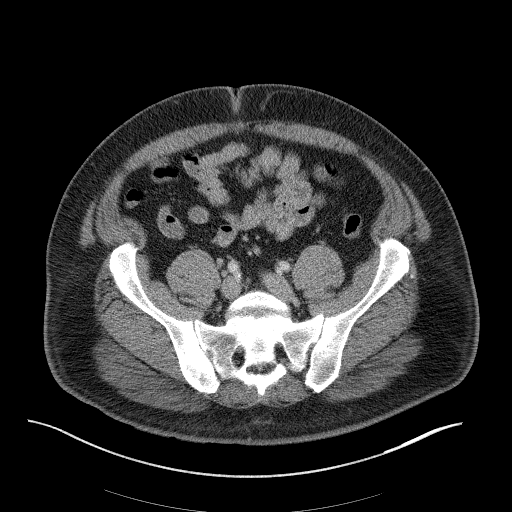
[im 74/87  lung]
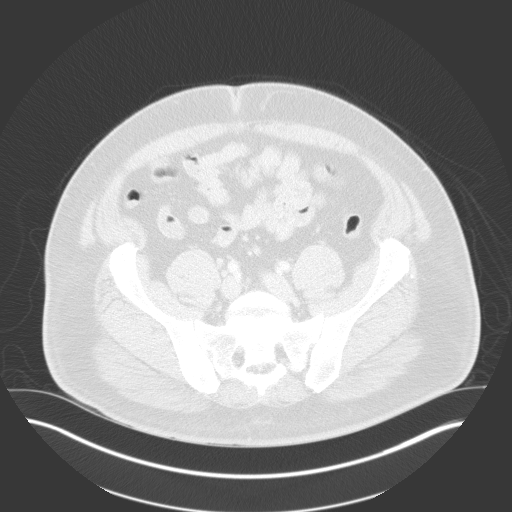
[im 74/87  bone]
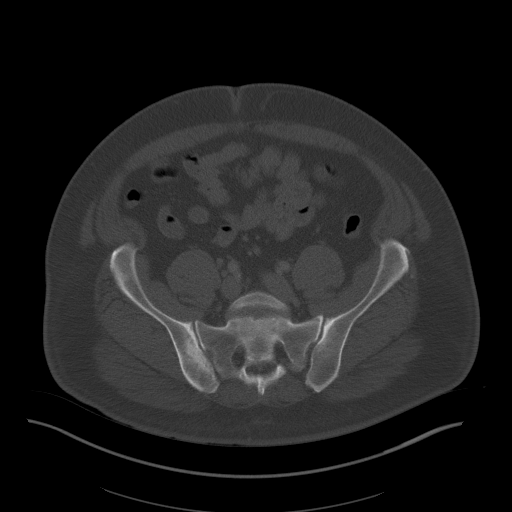
[im 78/87  lung]
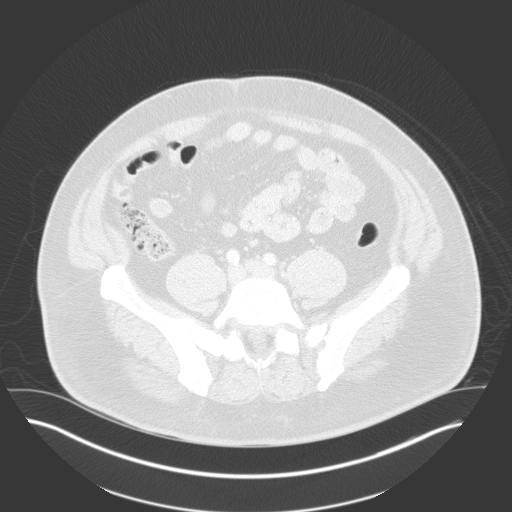
[im 82/87  soft-tissue]
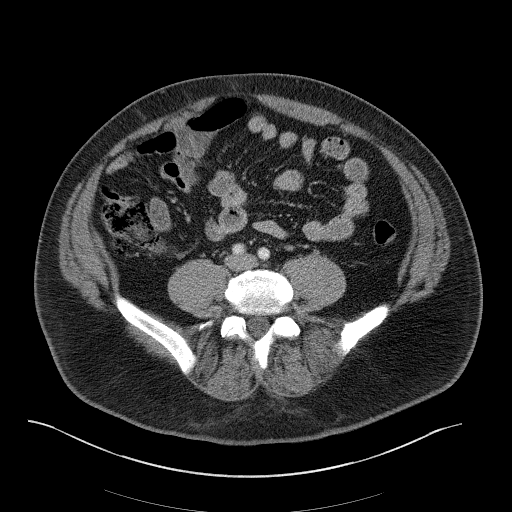
[im 82/87  lung]
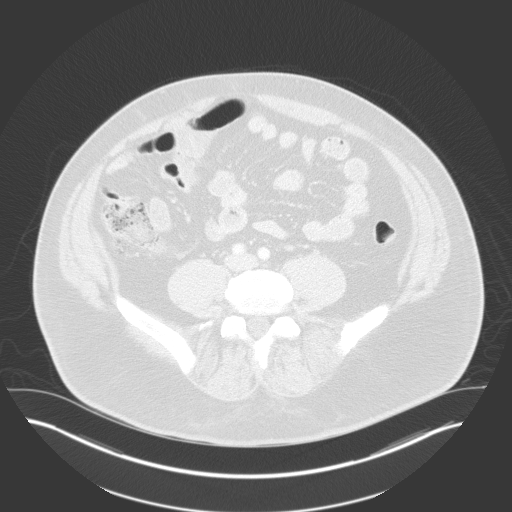

[Series 3: pelvis 2.0 coronal · coronal · 0.94mm/px · 3 of 162 slices shown]
[im 41/162  soft-tissue]
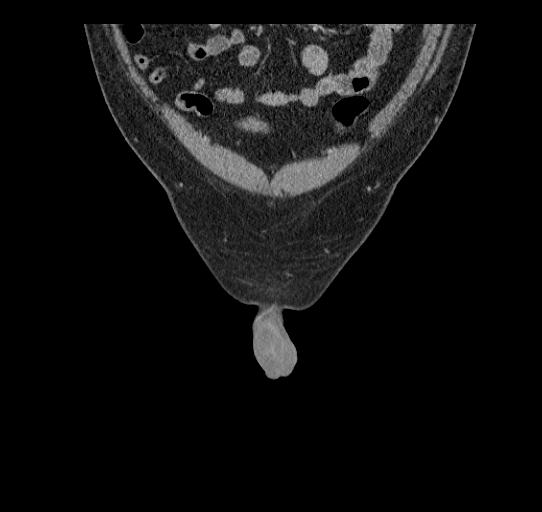
[im 81/162  soft-tissue]
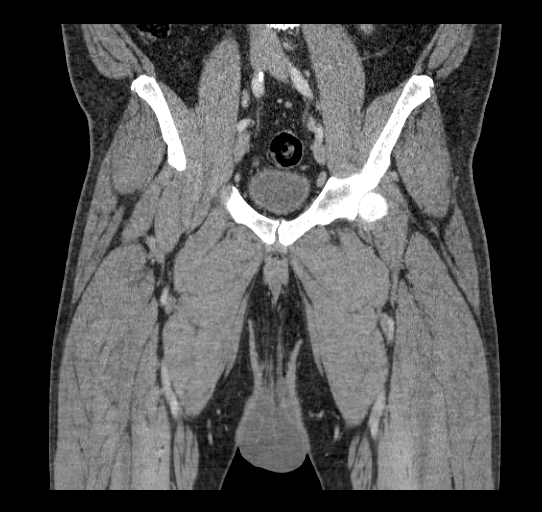
[im 121/162  soft-tissue]
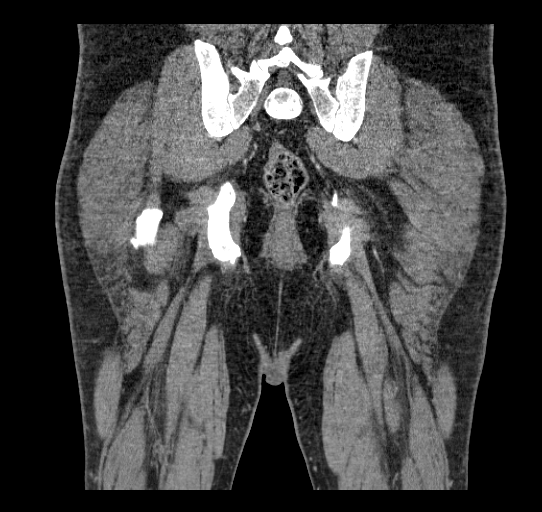

[15 of 46 positions shown; findings below may reference images not displayed]

FINDINGS: The scrotal wall is diffusely edematous and thickened, with an
immediately subcutaneous 22 mm abscess along the posterior midline
scrotum. There is no subcutaneous gas. No evidence of hydrocele or
intrascrotal infection. No perineal or supralevator inflammation.

The pelvic viscera are unremarkable.

Numerous punctate sclerotic foci in the pelvis and hips consistent
with osteopoikilosis.
IMPRESSION: 1. 22 mm scrotal abscess with surrounding cellulitis.
2. No subcutaneous gas.
3. Osteopoikilosis.

## 2016-07-01 ENCOUNTER — Emergency Department (HOSPITAL_COMMUNITY): Payer: Self-pay

## 2016-07-01 ENCOUNTER — Encounter (HOSPITAL_COMMUNITY): Payer: Self-pay | Admitting: Emergency Medicine

## 2016-07-01 ENCOUNTER — Emergency Department (HOSPITAL_COMMUNITY)
Admission: EM | Admit: 2016-07-01 | Discharge: 2016-07-01 | Disposition: A | Discharge status: Home / Self Care | Payer: Self-pay | Attending: Emergency Medicine | Admitting: Emergency Medicine

## 2016-07-01 DIAGNOSIS — Y999 Unspecified external cause status: Secondary | ICD-10-CM | POA: Insufficient documentation

## 2016-07-01 DIAGNOSIS — F17210 Nicotine dependence, cigarettes, uncomplicated: Secondary | ICD-10-CM | POA: Insufficient documentation

## 2016-07-01 DIAGNOSIS — W268XXA Contact with other sharp object(s), not elsewhere classified, initial encounter: Secondary | ICD-10-CM | POA: Insufficient documentation

## 2016-07-01 DIAGNOSIS — Y939 Activity, unspecified: Secondary | ICD-10-CM | POA: Insufficient documentation

## 2016-07-01 DIAGNOSIS — E119 Type 2 diabetes mellitus without complications: Secondary | ICD-10-CM | POA: Insufficient documentation

## 2016-07-01 DIAGNOSIS — S68123A Partial traumatic metacarpophalangeal amputation of left middle finger, initial encounter: Secondary | ICD-10-CM | POA: Insufficient documentation

## 2016-07-01 DIAGNOSIS — Z794 Long term (current) use of insulin: Secondary | ICD-10-CM | POA: Insufficient documentation

## 2016-07-01 DIAGNOSIS — S68119A Complete traumatic metacarpophalangeal amputation of unspecified finger, initial encounter: Secondary | ICD-10-CM

## 2016-07-01 DIAGNOSIS — S61012A Laceration without foreign body of left thumb without damage to nail, initial encounter: Secondary | ICD-10-CM | POA: Insufficient documentation

## 2016-07-01 DIAGNOSIS — S68129A Partial traumatic metacarpophalangeal amputation of unspecified finger, initial encounter: Secondary | ICD-10-CM

## 2016-07-01 DIAGNOSIS — Y929 Unspecified place or not applicable: Secondary | ICD-10-CM | POA: Insufficient documentation

## 2016-07-01 DIAGNOSIS — Z7982 Long term (current) use of aspirin: Secondary | ICD-10-CM | POA: Insufficient documentation

## 2016-07-01 MED ORDER — HYDROCODONE-ACETAMINOPHEN 5-325 MG PO TABS
1.0000 | ORAL_TABLET | Freq: Once | ORAL | Status: AC
  Administered 2016-07-01: 1 via ORAL
  Filled 2016-07-01: qty 1

## 2016-07-01 MED ORDER — LIDOCAINE HCL (PF) 1 % IJ SOLN
30.0000 mL | Freq: Once | INTRAMUSCULAR | Status: DC
  Filled 2016-07-01: qty 30

## 2016-07-01 MED ORDER — IBUPROFEN 400 MG PO TABS: 400.0000 mg | ORAL_TABLET | Freq: Three times a day (TID) | ORAL | 0 refills | Status: AC

## 2016-07-01 MED ORDER — HYDROCODONE-ACETAMINOPHEN 5-325 MG PO TABS: 1.0000 | ORAL_TABLET | Freq: Four times a day (QID) | ORAL | 0 refills | Status: AC | PRN
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
## 2016-07-01 NOTE — Discharge Instructions (Signed)
As discussed, you will have soreness in your fingertips for the next few days.  Please monitor your situation carefully, and return for concerning changes.  Otherwise, please remove the dressing in 36 hours, clean the wounds, and then keep them clean and dry.  You should have the single stitch removed in seven days.

## 2016-07-01 NOTE — ED Provider Notes (Signed)
[facility]-EMERGENCY DEPT Provider Note   CSN: [id number] Arrival date & time: 07/01/16  1527     History   Chief Complaint Chief Complaint  Patient presents with  . Laceration    HPI Ronald Abbott is a 46 y.o. male.  HPI  Patient presents immediately after sustaining an injury following an attempt to fix a propeller on a remote control airplane. Patient notes that he was attempting to hold the implant in place, the propeller began to spin, and the patient sustained lacerations to multiple digits on the left hand. Subsequently, the patient developed pain in the first third and fourth digits, distally, sharp, severe, worse with pressure or motion. He has preserved sensation in the thumb, but there is partial amputation of the third and fourth digits.  History reviewed. No pertinent past medical history.  Patient Active Problem List   Diagnosis Date Noted  . Diabetes mellitus type 2, uncontrolled (HCC)   . Thrombocytopenia (HCC)   . Abscess 04/24/2014  . 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 Scrotal abscess 04/24/2014  . Type 2 diabetes mellitus with hyperglycemia (HCC) 04/24/2014  . Tobacco use disorder 04/24/2014  . Morbid obesity (HCC) 04/24/2014  . Hyponatremia 04/24/2014    Past Surgical History:  Procedure Laterality Date  . arm surgery    . FRACTURE SURGERY     R forearm fx, L elbow       Home Medications    Prior to Admission medications   Medication Sig Start Date End Date Taking? Authorizing Provider  aspirin-acetaminophen-caffeine (EXCEDRIN MIGRAINE) [phone] MG per tablet Take 2 tablets by mouth every 6 (six) hours as needed for headache.    [provider]  insulin aspart protamine- aspart (NOVOLOG MIX 70/30) (70-30) 100 UNIT/ML injection Inject 0.2 mLs (20 Units total) into the skin 2 (two) times daily with a meal. 04/25/14   Alison Murray, MD  sulfamethoxazole-trimethoprim (BACTRIM DS,SEPTRA DS)  800-160 MG per tablet Take 1 tablet by mouth 2 (two) times daily. 04/25/14   Alison Murray, MD    Family History Family History  Problem Relation Age of Onset  . Cancer Maternal Grandmother   . Diabetes Paternal Grandfather   . Hypertension Paternal Grandfather   . Coronary artery disease Paternal Grandfather   . Heart Problems Paternal Grandfather     Social History Social History  Substance Use Topics  . Smoking status: Current Some Day Smoker    Packs/day: 0.50    Years: 20.00    Types: Cigarettes  . Smokeless tobacco: Not on file  . Alcohol use No     Allergies   Asparagus; Broccoli [brassica oleracea italica]; and Other   Review of Systems Review of Systems  Constitutional:       Per HPI, otherwise negative  HENT:       Per HPI, otherwise negative  Respiratory:       Per HPI, otherwise negative  Cardiovascular:       Per HPI, otherwise negative  Gastrointestinal: Negative for vomiting.  Endocrine:       Negative aside from HPI  Genitourinary:       Neg aside from HPI   Musculoskeletal:       Per HPI, otherwise negative  Skin: Positive for wound.  Allergic/Immunologic: Negative for immunocompromised state.  Neurological: Negative for syncope and numbness.  Hematological: Negative.      Physical Exam Updated Vital Signs BP (!) 152/108 (BP Location: Left Arm)   Pulse  98   Temp 98.4 F (36.9 C) (Oral)   Resp 20   SpO2 98%   Physical Exam  Constitutional: He is oriented to person, place, and time. He appears well-developed. No distress.  HENT:  Head: Normocephalic and atraumatic.  Eyes: Conjunctivae and EOM are normal.  Cardiovascular: Normal rate and regular rhythm.   Pulmonary/Chest: Effort normal. No stridor. No respiratory distress.  Abdominal: He exhibits no distension.  Musculoskeletal: He exhibits no edema.       Arms: Neurological: He is alert and oriented to person, place, and time.  Skin: Skin is warm and dry.  Psychiatric: He has a  normal mood and affect.  Nursing note and vitals reviewed.    ED Treatments / Results   Radiology Dg Hand Complete Left  Result Date: 07/01/2016 CLINICAL DATA:  Lacerations due to trauma. EXAM: LEFT HAND - COMPLETE 3+ VIEW COMPARISON:  None. FINDINGS: Evaluation of fine cortical detail is limited due to overlapping bandaging. No foreign bodies identified. There is a bone cyst in the scaphoid. Carpal bones are otherwise within normal limits. No fractures are seen. IMPRESSION: Lacerations with no foreign body or fracture identified. Electronically Signed   By: Gerome Sam III M.D   On: 07/01/2016 16:15    Procedures Procedures (including critical care time)  Medications Ordered in ED Medications  lidocaine (PF) (XYLOCAINE) 1 % injection 30 mL (not administered)   LACERATION REPAIR #1 Performed by: Gerhard Munch Authorized by: Gerhard Munch Consent: Verbal consent obtained. Risks and benefits: risks, benefits and alternatives were discussed Consent given by: patient Patient identity confirmed: provided demographic data Prepped and Draped in normal sterile fashion Wound explored  Laceration Location: L thumb lateral aspect  Laceration Length: 3cm  No Foreign Bodies seen or palpated  Irrigation method: syringe Amount of cleaning: standard  Skin closure: dermabond  Number of tubes: 1  Technique: close as possible.  Patient has y shaped irregular flap  Patient tolerance: Patient tolerated the procedure well with no immediate complications.  LACERATION REPAIR #2 Performed by: Gerhard Munch Authorized by: Gerhard Munch Consent: Verbal consent obtained. Risks and benefits: risks, benefits and alternatives were discussed Consent given by: patient Patient identity confirmed: provided demographic data Prepped and Draped in normal sterile fashion Wound explored  Laceration Location: partial amputation of third digit w nailbed involvement.  Laceration  Length: 5cm circumferential  w 3cm linear lac through the lateral nailbed -perpendicular  No Foreign Bodies seen or palpated  Anesthesia: digital block  Local anesthetic: lidocaine 1% no epinephrine  Anesthetic total: 5 ml  Irrigation method: syringe Amount of cleaning: standard  Skin closure: removal of non-viable flap.  One 6-0 suture through additional wound to approximate the remaining tissue.  Number of sutures: 1  Technique: removal of non-viable tissue and rough approximation through the wound  Patient tolerance: Patient tolerated the procedure well with no immediate complications.    Initial Impression / Assessment and Plan / ED Course  I have reviewed the triage vital signs and the nursing notes.  Pertinent labs & imaging results that were available during my care of the patient were reviewed by me and considered in my medical decision making (see chart for details).  This patient presents after sustaining burns to multiple fingers after working with a model remote control airplane. Patient had injuries to 3 digits, 2 of which required some form of repair. The remaining digit had nonadhesive pressure dressing applied, with decent hemostatic control. Patient in no other injuries, no evidence for  neurologic dysfunction, and after a lengthy conversation on home care, and follow-up as needed, patient discharged with analgesia.  Final Clinical Impressions(s) / ED Diagnoses   Final diagnoses:  Fingertip amputation, initial encounter  Laceration of left thumb without foreign body without damage to nail, initial encounter    New Prescriptions New Prescriptions   HYDROCODONE-ACETAMINOPHEN (NORCO/VICODIN) 5-325 MG TABLET    Take 1 tablet by mouth every 6 (six) hours as needed for severe pain.   IBUPROFEN (ADVIL,MOTRIN) 400 MG TABLET    Take 1 tablet (400 mg total) by mouth 3 (three) times daily. Take one tablet three times daily for three days     Gerhard MunchLockwood, Dolora Ridgely,  MD 07/01/16 [id number]

## 2016-07-09 ENCOUNTER — Emergency Department (HOSPITAL_COMMUNITY)
Admission: EM | Admit: 2016-07-09 | Discharge: 2016-07-09 | Disposition: A | Discharge status: Home / Self Care | Payer: BLUE CROSS/BLUE SHIELD | Attending: Emergency Medicine | Admitting: Emergency Medicine

## 2016-07-09 ENCOUNTER — Encounter (HOSPITAL_COMMUNITY): Payer: Self-pay | Admitting: Emergency Medicine

## 2016-07-09 DIAGNOSIS — Z794 Long term (current) use of insulin: Secondary | ICD-10-CM | POA: Insufficient documentation

## 2016-07-09 DIAGNOSIS — F17210 Nicotine dependence, cigarettes, uncomplicated: Secondary | ICD-10-CM | POA: Insufficient documentation

## 2016-07-09 DIAGNOSIS — Z7982 Long term (current) use of aspirin: Secondary | ICD-10-CM | POA: Insufficient documentation

## 2016-07-09 DIAGNOSIS — Z79899 Other long term (current) drug therapy: Secondary | ICD-10-CM | POA: Insufficient documentation

## 2016-07-09 DIAGNOSIS — E119 Type 2 diabetes mellitus without complications: Secondary | ICD-10-CM | POA: Insufficient documentation

## 2016-07-09 DIAGNOSIS — M7989 Other specified soft tissue disorders: Secondary | ICD-10-CM | POA: Insufficient documentation

## 2016-07-09 DIAGNOSIS — Z4802 Encounter for removal of sutures: Secondary | ICD-10-CM | POA: Insufficient documentation

## 2016-07-09 DIAGNOSIS — Z5189 Encounter for other specified aftercare: Secondary | ICD-10-CM

## 2016-07-09 MED ORDER — BACITRACIN ZINC 500 UNIT/GM EX OINT
TOPICAL_OINTMENT | Freq: Two times a day (BID) | CUTANEOUS | Status: DC
  Administered 2016-07-09: 1 via TOPICAL
  Filled 2016-07-09: qty 2.7

## 2016-07-09 NOTE — ED Notes (Signed)
ED Provider at bedside. 

## 2016-07-09 NOTE — ED Provider Notes (Signed)
[facility]-EMERGENCY DEPT Provider Note   CSN: [id number] Arrival date & time: 07/09/16  1530     History   Chief Complaint Chief Complaint  Patient presents with  . Suture / Staple Removal  . Wound Check    HPI Ronald Abbott is a 46 y.o. male who presents for suture removal. [facility] 07/01/16 for evaluation of left hand injury that occurred when he hit it against a propeller. Patient returns today To have the sutures removed. He states that he attempted to remove them last night but doesn't know if he was able to get it. He reports that he has been soaking the fingers and hydrogen peroxide every night and has had improvement and wounds and pain. He has been applying antibiotic ointment. He denies any discharge from the fingers. He denies any fever or numbness.  The history is provided by the patient.    History reviewed. No pertinent past medical history.  Patient Active Problem List   Diagnosis Date Noted  . Diabetes mellitus type 2, uncontrolled (HCC)   . Thrombocytopenia (HCC)   . Abscess 04/24/2014  . 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 Scrotal abscess 04/24/2014  . Type 2 diabetes mellitus with hyperglycemia (HCC) 04/24/2014  . Tobacco use disorder 04/24/2014  . Morbid obesity (HCC) 04/24/2014  . Hyponatremia 04/24/2014    Past Surgical History:  Procedure Laterality Date  . arm surgery    . FRACTURE SURGERY     R forearm fx, L elbow       Home Medications    Prior to Admission medications   Medication Sig Start Date End Date Taking? Authorizing Provider  aspirin-acetaminophen-caffeine (EXCEDRIN MIGRAINE) [phone] MG per tablet Take 2 tablets by mouth every 6 (six) hours as needed for headache.    [provider]  HYDROcodone-acetaminophen (NORCO/VICODIN) 5-325 MG tablet Take 1 tablet by mouth every 6 (six) hours as needed for severe pain. 07/01/16   Gerhard MunchLockwood, Robert, MD  insulin  aspart protamine- aspart (NOVOLOG MIX 70/30) (70-30) 100 UNIT/ML injection Inject 0.2 mLs (20 Units total) into the skin 2 (two) times daily with a meal. 04/25/14   Alison Murrayevine, Alma M, MD  sulfamethoxazole-trimethoprim (BACTRIM DS,SEPTRA DS) 800-160 MG per tablet Take 1 tablet by mouth 2 (two) times daily. 04/25/14   Alison Murrayevine, Alma M, MD    Family History Family History  Problem Relation Age of Onset  . Cancer Maternal Grandmother   . Diabetes Paternal Grandfather   . Hypertension Paternal Grandfather   . Coronary artery disease Paternal Grandfather   . Heart Problems Paternal Grandfather     Social History Social History  Substance Use Topics  . Smoking status: Current Some Day Smoker    Packs/day: 0.50    Years: 20.00    Types: Cigarettes  . Smokeless tobacco: Not on file  . Alcohol use No     Allergies   Asparagus; Broccoli [brassica oleracea italica]; and Other   Review of Systems Review of Systems  Constitutional: Negative for fever.  Skin: Positive for wound.  Neurological: Negative for numbness.     Physical Exam Updated Vital Signs BP (!) 152/102 (BP Location: Right Arm)   Pulse 83   Temp 98.5 F (36.9 C) (Oral)   Resp 16   SpO2 96%   Physical Exam  Constitutional: He appears well-developed and well-nourished.  HENT:  Head: Normocephalic and atraumatic.  Eyes: Conjunctivae and EOM are normal. Right eye exhibits no  discharge. Left eye exhibits no discharge. No scleral icterus.  Cardiovascular:  Pulses:      Radial pulses are 2+ on the right side, and 2+ on the left side.  Pulmonary/Chest: Effort normal.  Musculoskeletal: He exhibits no deformity.  Full range of motion of left wrist. Patient is able to make a fist without difficulty. Full flexion extension of left digits intact fully.  Neurological: He is alert.  Skin: Skin is warm and dry. Capillary refill takes less than 2 seconds.  Third and fourth digits of left hand with partial avulsions and well healed  wounds to the distal tips. Fourth digit has some mild swelling and erythema lateral to the nail bed. No fluctuance noted. No active drainage. Left thumb with well-healed wound to the distal tip. No active drainage.  Psychiatric: He has a normal mood and affect. His speech is normal and behavior is normal.  Nursing note and vitals reviewed.    ED Treatments / Results  Labs (all labs ordered are listed, but only abnormal results are displayed) Labs Reviewed - No data to display  EKG  EKG Interpretation None       Radiology No results found.  Procedures Procedures (including critical care time)  Medications Ordered in ED Medications  bacitracin ointment (not administered)     Initial Impression / Assessment and Plan / ED Course  I have reviewed the triage vital signs and the nursing notes.  Pertinent labs & imaging results that were available during my care of the patient were reviewed by me and considered in my medical decision making (see chart for details).     46 year old male who presents for suture removal. Patient was seen on 07/01/16 for finger laceration after hitting them on a propeller. Patient had 2 sutures in place on the left fourth digit. He attempted to remove them last night but doesn't know if he was successful. Good range of motion of fingers. He is able to make a fist and flex and extend all digits of the left hand. He does have some mild swelling/tenderness to lateral nail bed. No signs of paronychia. No fluctuance. Nothing to drain at this time. Wounds were examined. No active drainage. Sutures were not visualized. Discussed with Dr. Jeraldine Loots, who evaluated the patient himself. Sutures were not found. Likely fell out were removed by patient last night. We will apply bacitracin ointment and sterile dressing the department. Instructed patient to continue doing appropriate wound care at home. Strict return precautions discussed. Patient was understanding and  agreement to plan.`  Final Clinical Impressions(s) / ED Diagnoses   Final diagnoses:  Visit for wound check  Visit for suture removal    New Prescriptions New Prescriptions   No medications on file     Rosana Hoes 07/09/16 1651    Gerhard Munch, MD 07/12/16 2351

## 2016-07-09 NOTE — ED Triage Notes (Signed)
Pt verbalizes here for stitch removal and wound check from left middle and left right laceration.

## 2016-07-09 NOTE — Discharge Instructions (Signed)
Continue soaking the wounds at night. Keep wounds clean and dry and apply bandages for protection.   Return to the Emergency Department for any worsening swelling/redness to the fingers, fevers or any other worsening concerns.

## 2017-08-07 ENCOUNTER — Encounter (HOSPITAL_COMMUNITY): Payer: Self-pay | Admitting: Emergency Medicine

## 2017-08-07 ENCOUNTER — Emergency Department (HOSPITAL_COMMUNITY): Payer: BLUE CROSS/BLUE SHIELD

## 2017-08-07 ENCOUNTER — Other Ambulatory Visit: Payer: Self-pay

## 2017-08-07 ENCOUNTER — Emergency Department (HOSPITAL_COMMUNITY)
Admission: EM | Admit: 2017-08-07 | Discharge: 2017-08-07 | Disposition: A | Discharge status: Home / Self Care | Payer: BLUE CROSS/BLUE SHIELD | Attending: Emergency Medicine | Admitting: Emergency Medicine

## 2017-08-07 DIAGNOSIS — N509 Disorder of male genital organs, unspecified: Secondary | ICD-10-CM | POA: Diagnosis present

## 2017-08-07 DIAGNOSIS — F17210 Nicotine dependence, cigarettes, uncomplicated: Secondary | ICD-10-CM | POA: Insufficient documentation

## 2017-08-07 DIAGNOSIS — Z79899 Other long term (current) drug therapy: Secondary | ICD-10-CM | POA: Diagnosis not present

## 2017-08-07 DIAGNOSIS — N492 Inflammatory disorders of scrotum: Secondary | ICD-10-CM | POA: Insufficient documentation

## 2017-08-07 DIAGNOSIS — L0291 Cutaneous abscess, unspecified: Secondary | ICD-10-CM

## 2017-08-07 DIAGNOSIS — E119 Type 2 diabetes mellitus without complications: Secondary | ICD-10-CM | POA: Insufficient documentation

## 2017-08-07 LAB — BASIC METABOLIC PANEL
ANION GAP: 10 (ref 5–15)
BUN: 6 mg/dL (ref 6–20)
CALCIUM: 8.6 mg/dL — AB (ref 8.9–10.3)
CO2: 25 mmol/L (ref 22–32)
CREATININE: 0.74 mg/dL (ref 0.61–1.24)
Chloride: 103 mmol/L (ref 101–111)
GFR calc Af Amer: >60 mL/min (ref >60)
GFR calc non Af Amer: >60 mL/min (ref >60)
GLUCOSE: 239 mg/dL — AB (ref 65–99)
Potassium: 3.6 mmol/L (ref 3.5–5.1)
Sodium: 138 mmol/L (ref 135–145)

## 2017-08-07 LAB — CBC
HEMATOCRIT: 47.3 % (ref 39.0–52.0)
Hemoglobin: 16.5 g/dL (ref 13.0–17.0)
MCH: 29.4 pg (ref 26.0–34.0)
MCHC: 34.9 g/dL (ref 30.0–36.0)
MCV: 84.2 fL (ref 78.0–100.0)
Platelets: 126 10*3/uL — ABNORMAL LOW (ref 150–400)
RBC: 5.62 MIL/uL (ref 4.22–5.81)
RDW: 13 % (ref 11.5–15.5)
WBC: 11.1 10*3/uL — ABNORMAL HIGH (ref 4.0–10.5)

## 2017-08-07 MED ORDER — MORPHINE SULFATE (PF) 4 MG/ML IV SOLN
4.0000 mg | Freq: Once | INTRAVENOUS | Status: AC
  Administered 2017-08-07: 4 mg via INTRAMUSCULAR

## 2017-08-07 MED ORDER — DOXYCYCLINE HYCLATE 100 MG PO CAPS: 100.0000 mg | ORAL_CAPSULE | Freq: Two times a day (BID) | ORAL | 0 refills | Status: AC

## 2017-08-07 MED ORDER — LIDOCAINE HCL (PF) 1 % IJ SOLN
5.0000 mL | Freq: Once | INTRAMUSCULAR | Status: AC
  Administered 2017-08-07: 5 mL
  Filled 2017-08-07: qty 30

## 2017-08-07 MED ORDER — NAPROXEN 500 MG PO TABS: 500.0000 mg | ORAL_TABLET | Freq: Two times a day (BID) | ORAL | 0 refills | Status: AC

## 2017-08-07 MED ORDER — MORPHINE SULFATE (PF) 4 MG/ML IV SOLN
4.0000 mg | Freq: Once | INTRAVENOUS | Status: DC
  Filled 2017-08-07: qty 1

## 2017-08-07 NOTE — ED Provider Notes (Signed)
[facility]-EMERGENCY DEPT Provider Note   CSN: [id number] Arrival date & time: 08/07/17  1321     History   Chief Complaint Chief Complaint  Patient presents with  . Abscess    HPI Ronald Abbott is a 47 y.o. male.  HPI Patient presents to the emergency room for evaluation of scrotal swelling.  Patient has a history of having a previous scrotal abscess.  Patient noticed a small area of swelling in the right side of his scrotum a couple of days ago.  Over the last few days the swelling has increased.  This morning he feels like it significantly more swollen and the whole scrotum appears swollen.  He denies any fevers or chills.  No vomiting or diarrhea. History reviewed. No pertinent past medical history.  Patient Active Problem List   Diagnosis Date Noted  . Diabetes mellitus type 2, uncontrolled (HCC)   . Thrombocytopenia (HCC)   . Abscess 04/24/2014  .  04/24/2014  . Scrotal abscess 04/24/2014  . Type 2 diabetes mellitus with hyperglycemia (HCC) 04/24/2014  . Tobacco use disorder 04/24/2014  . Morbid obesity (HCC) 04/24/2014  . Hyponatremia 04/24/2014  Note:  Pt denies history of pancreatic transplant  Past Surgical History:  Procedure Laterality Date  . arm surgery    . FRACTURE SURGERY     R forearm fx, L elbow        Home Medications    Prior to Admission medications   Medication Sig Start Date End Date Taking? Authorizing Provider  aspirin-acetaminophen-caffeine (EXCEDRIN MIGRAINE) [id number] MG tablet Take 2 tablets by mouth daily.   Yes [provider]  doxycycline (VIBRAMYCIN) 100 MG capsule Take 1 capsule (100 mg total) by mouth 2 (two) times daily. 08/07/17   Linwood Dibbles, MD  HYDROcodone-acetaminophen (NORCO/VICODIN) 5-325 MG tablet Take 1 tablet by mouth every 6 (six) hours as needed for severe pain. Patient not taking: Reported on 08/07/2017 07/01/16   Gerhard Munch, MD  insulin aspart protamine- aspart (NOVOLOG MIX 70/30)  (70-30) 100 UNIT/ML injection Inject 0.2 mLs (20 Units total) into the skin 2 (two) times daily with a meal. Patient not taking: Reported on 08/07/2017 04/25/14   Alison Murray, MD  naproxen (NAPROSYN) 500 MG tablet Take 1 tablet (500 mg total) by mouth 2 (two) times daily with a meal. As needed for pain 08/07/17   Linwood Dibbles, MD  sulfamethoxazole-trimethoprim (BACTRIM DS,SEPTRA DS) 800-160 MG per tablet Take 1 tablet by mouth 2 (two) times daily. Patient not taking: Reported on 08/07/2017 04/25/14   Alison Murray, MD    Family History Family History  Problem Relation Age of Onset  . Cancer Maternal Grandmother   . Diabetes Paternal Grandfather   . Hypertension Paternal Grandfather   . Coronary artery disease Paternal Grandfather   . Heart Problems Paternal Grandfather     Social History Social History   Tobacco Use  . Smoking status: Current Some Day Smoker    Packs/day: 0.50    Years: 20.00    Pack years: 10.00    Types: Cigarettes  Substance Use Topics  . Alcohol use: No  . Drug use: No     Allergies   Asparagus; Broccoli [brassica oleracea italica]; and Other   Review of Systems Review of Systems  All other systems reviewed and are negative.    Physical Exam Updated Vital Signs BP (!) 145/102   Pulse 74   Temp 98.6 F (37 C) (Oral)   Resp 18  SpO2 94%   Physical Exam  Constitutional: He appears well-developed and well-nourished. No distress.  HENT:  Head: Normocephalic and atraumatic.  Right Ear: External ear normal.  Left Ear: External ear normal.  Eyes: Conjunctivae are normal. Right eye exhibits no discharge. Left eye exhibits no discharge. No scleral icterus.  Neck: Neck supple. No tracheal deviation present.  Cardiovascular: Normal rate.  Pulmonary/Chest: Effort normal. No stridor. No respiratory distress.  Abdominal: He exhibits no distension. Hernia confirmed negative in the right inguinal area and confirmed negative in the left inguinal area.    Genitourinary: Penis normal. Right testis shows mass, swelling and tenderness. Left testis shows no mass, no swelling and no tenderness.  Musculoskeletal: He exhibits no edema.  Neurological: He is alert. Cranial nerve deficit: no gross deficits.  Skin: Skin is warm and dry. No rash noted.  Psychiatric: He has a normal mood and affect.  Nursing note and vitals reviewed.    ED Treatments / Results  Labs (all labs ordered are listed, but only abnormal results are displayed) Labs Reviewed  CBC - Abnormal; Notable for the following components:      Result Value   WBC 11.1 (*)    Platelets 126 (*)    All other components within normal limits  BASIC METABOLIC PANEL - Abnormal; Notable for the following components:   Glucose, Bld 239 (*)    Calcium 8.6 (*)    All other components within normal limits     Radiology US Scrotum W/doppler  Result Date: 08/07/2017 CLINICAL DATA:  Right scrotal swelling and palpable mass. Scrotal skin thickening and redness. Previous mid scrotal abscess in 2016. EXAM: SCROTAL ULTRASOUND DOPPLER ULTRASOUND OF THE TESTICLES TECHNIQUE: Complete ultrasound examination of the testicles, epididymis, and other scrotal structures was performed. Color and spectral Doppler ultrasound were also utilized to evaluate blood flow to the testicles. COMPARISON:  Pelvis CT with contrast dated 04/24/2014. FINDINGS: Right testicle Measurements: 4.8 x 3.2 x 2.6 cm. No mass or microlithiasis visualized. Left testicle Measurements: 4.3 x 3.2 x 2.0 cm. No mass or microlithiasis visualized. Right epididymis: Mildly enlarged with echogenic and hypoechoic components with normal vascularity. Left epididymis: Mildly enlarged with echogenic and hypoechoic components, including prominent vessels within and surrounding the epididymis. Otherwise, normal vascularity. Hydrocele:  Small right hydrocele. Varicocele:  Left varicocele. Pulsed Doppler interrogation of both testes demonstrates normal low  resistance arterial and venous waveforms bilaterally. Other: Diffuse scrotal skin thickening, right greater than left. There is also and irregular fluid collection in the midline posterior to the testicles containing diffuse internal echoes. This has surrounding soft tissue thickening and measures 4.7 x 2.4 x 2.0 cm. The surrounding tissue has increased vascularity with color Doppler. IMPRESSION: 1. 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 2. 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 3. Left varicocele. 4. Small right hydrocele. Electronically Signed   By: Beckie Salts M.D.   On: 08/07/2017 17:06    Procedures .Marland KitchenIncision and Drainage Date/Time: 08/07/2017 6:14 PM Performed by: Linwood Dibbles, MD Authorized by: Linwood Dibbles, MD   Consent:    Consent obtained:  Verbal   Consent given by:  Patient   Risks discussed:  Bleeding, incomplete drainage, pain and damage to other organs   Alternatives discussed:  No treatment Universal protocol:    Procedure explained and questions answered to patient or proxy's satisfaction: yes     Relevant documents present and verified: yes     Test  results available and properly labeled: yes     Imaging studies available: yes     Required blood products, implants, devices, and special equipment available: yes     Site/side marked: yes     Immediately prior to procedure a time out was called: yes     Patient identity confirmed:  Verbally with patient Location:    Type:  Abscess   Location: right scrotum. Pre-procedure details:    Skin preparation:  Betadine Anesthesia (see MAR for exact dosages):    Anesthesia method:  Local infiltration   Local anesthetic:  Lidocaine 1% w/o epi Procedure type:    Complexity:  Complex Procedure details:    Incision types:  Single straight   Incision depth:  Subcutaneous   Scalpel blade:  11   Wound management:  Probed and deloculated, irrigated with  saline and extensive cleaning   Drainage:  Purulent   Drainage amount:  Moderate   Packing materials:  1/2 in iodoform gauze   Amount 1/2" iodoform:  2 inch Post-procedure details:    Patient tolerance of procedure:  Tolerated well, no immediate complications   (including critical care time)  Medications Ordered in ED Medications  lidocaine (PF) (XYLOCAINE) 1 % injection 5 mL (5 mLs Infiltration Given 08/07/17 1817)  morphine 4 MG/ML injection 4 mg (4 mg Intramuscular Given 08/07/17 1745)     Initial Impression / Assessment and Plan / ED Course  I have reviewed the triage vital signs and the nursing notes.  Pertinent labs & imaging results that were available during my care of the patient were reviewed by me and considered in my medical decision making (see chart for details).  Clinical Course as of Aug 07 1816  Wed Aug 07, 2017  1817 Increased wbc and glucose.  No signs of dka   [JK]    Clinical Course User Index [JK] Linwood DibblesKnapp, Kailand Seda, MD    Patient presents with scrotal swelling.  Ultrasound was performed to evaluate for recurrent abscess versus mass or cyst.  Ultrasound demonstrates an abscess and scrotal tissue.  Labs are reassuring.  No signs of systemic infection.  Bedside I&D was performed.  Patient tolerated this procedure well.  Plan on discharge home with outpatient follow-up to remove the packing.  Final Clinical Impressions(s) / ED Diagnoses   Final diagnoses:  Abscess    ED Discharge Orders        Ordered    doxycycline (VIBRAMYCIN) 100 MG capsule  2 times daily     08/07/17 1812    naproxen (NAPROSYN) 500 MG tablet  2 times daily with meals     08/07/17 1812       Linwood DibblesKnapp, Dossie Ocanas, MD 08/07/17 1820

## 2017-08-07 NOTE — Discharge Instructions (Signed)
Consider following up with a urologist or a primary care doctor to check on the wound, you can also come to the ED to have the packing removed in 2 days, warm soaks to the area in the meantime, take the abx until you finish them

## 2017-08-07 NOTE — ED Triage Notes (Signed)
Pt verbalizes "scotal abscess is back."

## 2017-08-07 NOTE — ED Notes (Signed)
Noticed the abscess Monday and pain increases with ambulation. No drainage from site., denies urinary issues. Denies fever, N/V.

## 2019-08-13 IMAGING — US US SCROTUM W/ DOPPLER COMPLETE
1 series · 13 of 25 positions shown · non-contrast
Comparison: Pelvis CT with contrast dated 04/24/2014.

CLINICAL DATA: Right scrotal swelling and palpable mass. Scrotal
skin thickening and redness. Previous mid scrotal abscess in 4535.

EXAM:
SCROTAL ULTRASOUND
DOPPLER ULTRASOUND OF THE TESTICLES
TECHNIQUE: Complete ultrasound examination of the testicles, epididymis, and
other scrotal structures was performed. Color and spectral Doppler
ultrasound were also utilized to evaluate blood flow to the
testicles.

[Series 1: us scrotum w/ doppler complete · 0.08mm/px · 13 of 110 slices shown]
[im 1/110]
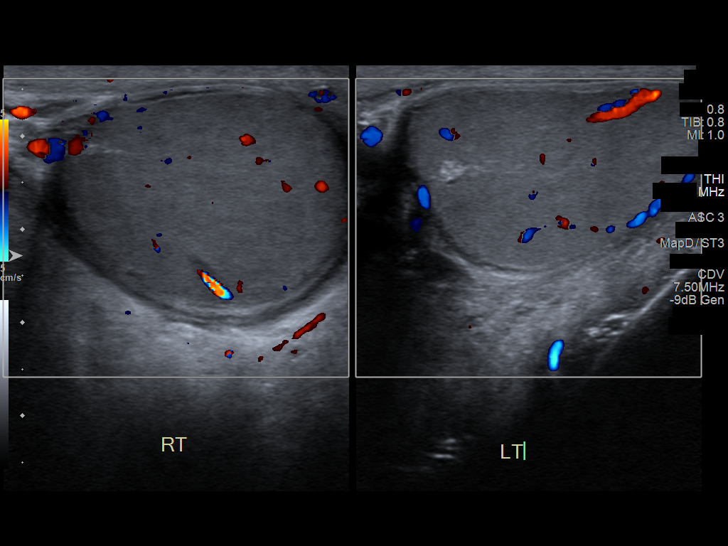
[im 10/110]
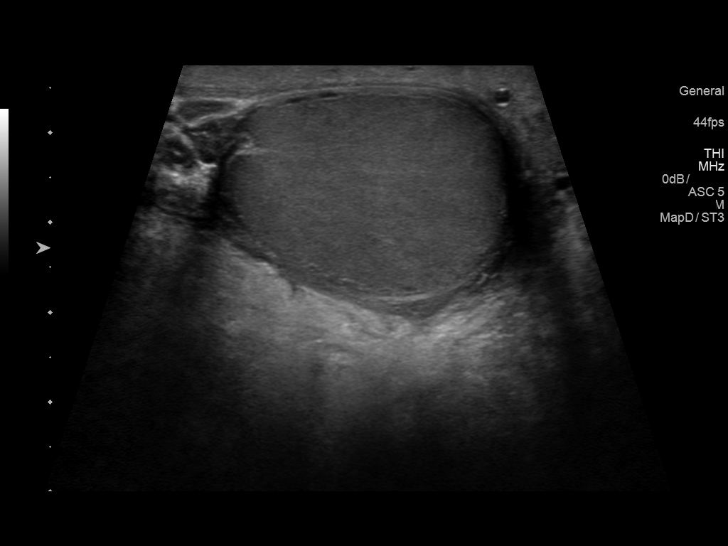
[im 19/110]
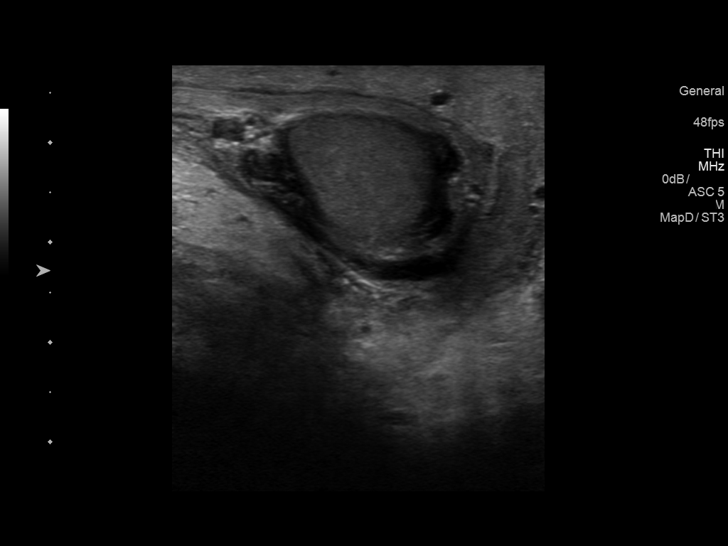
[im 28/110]
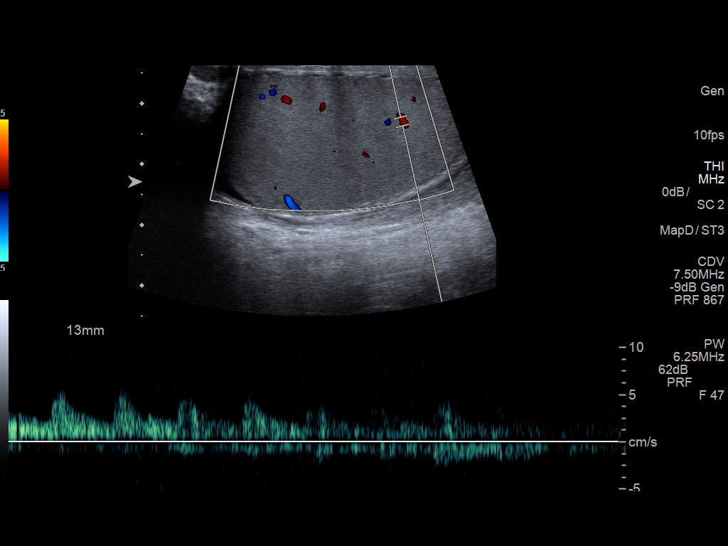
[im 37/110]
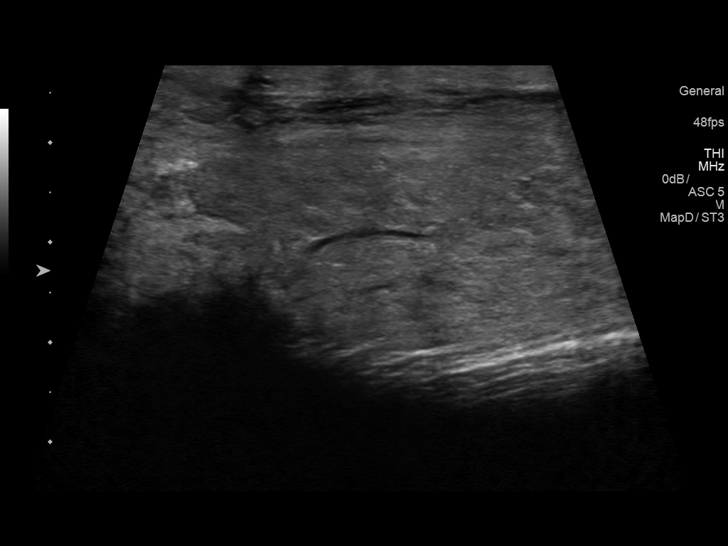
[im 46/110]
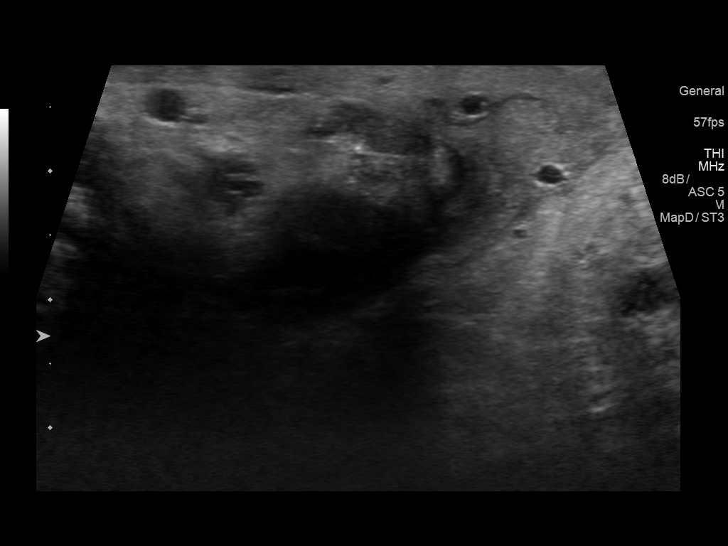
[im 55/110]
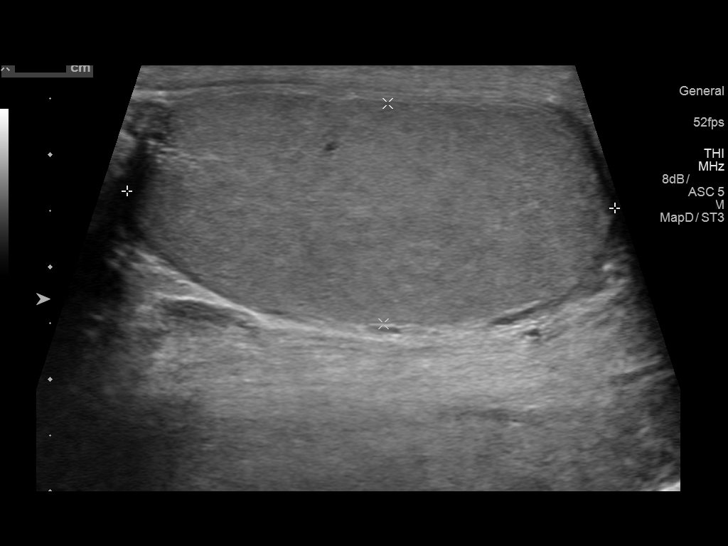
[im 64/110]
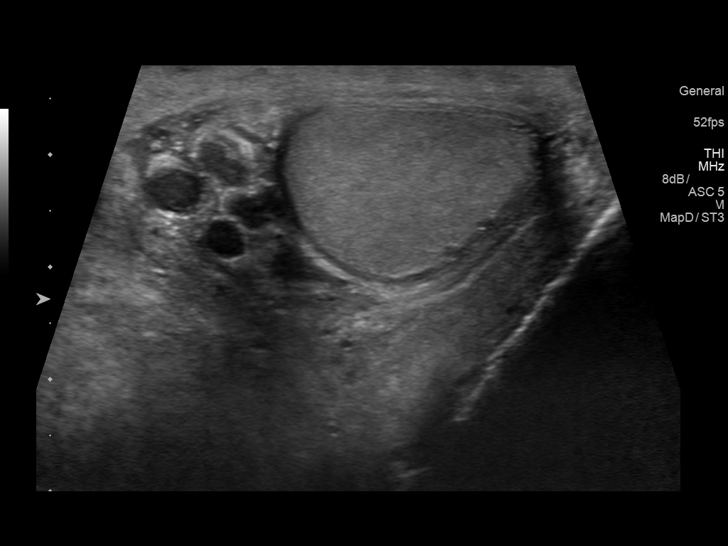
[im 73/110]
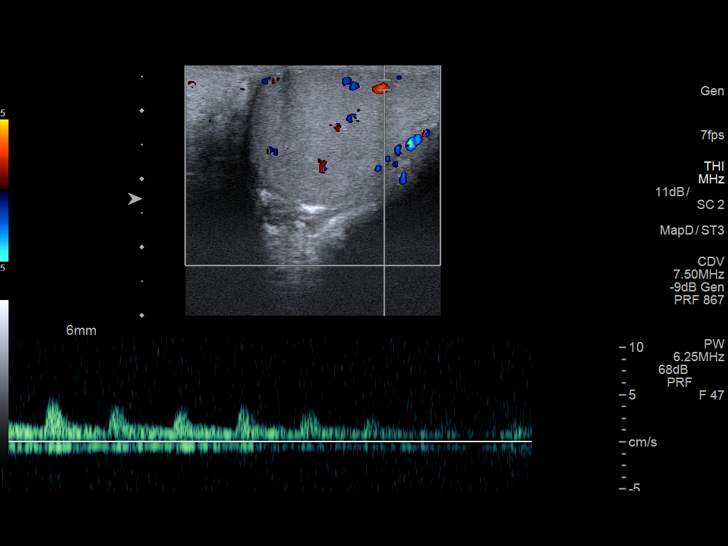
[im 82/110]
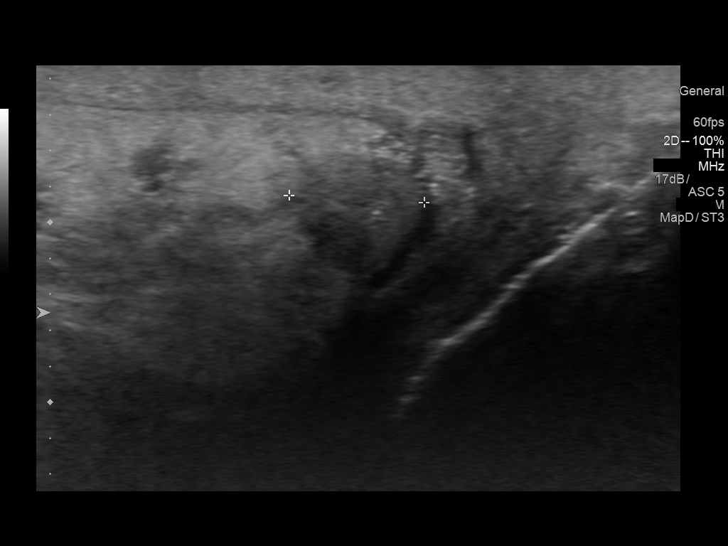
[im 91/110]
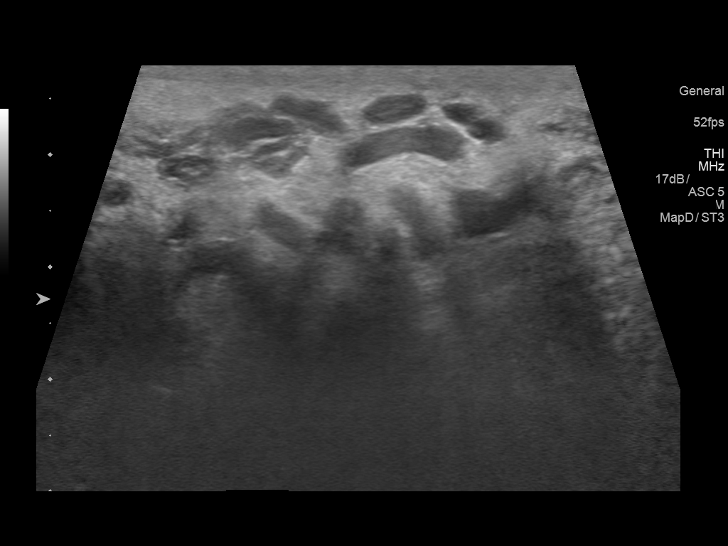
[im 100/110]
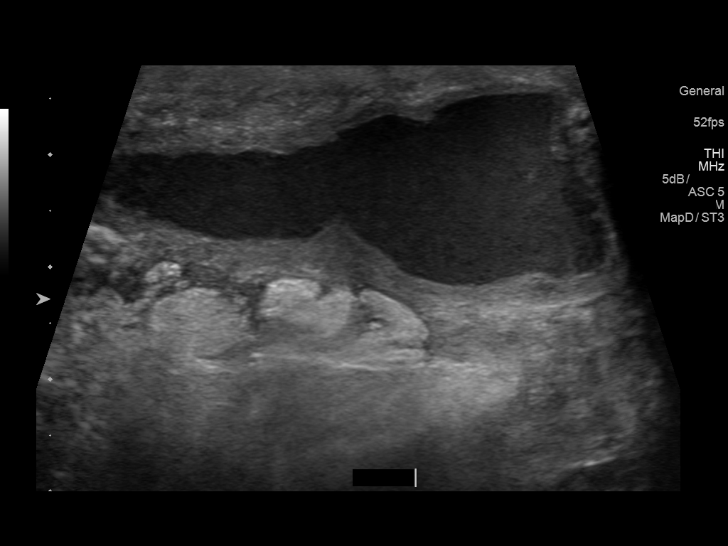
[im 110/110]
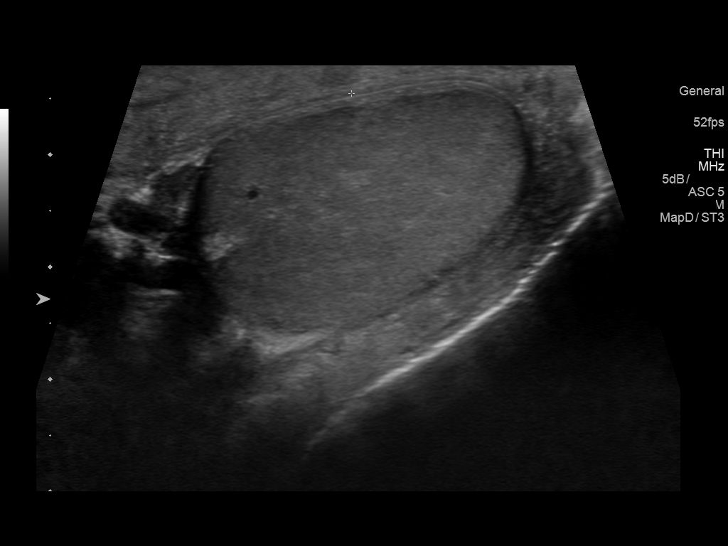

[13 of 25 positions shown; findings below may reference images not displayed]

FINDINGS: Right testicle

Measurements: 4.8 x 3.2 x 2.6 cm. No mass or microlithiasis
visualized.

Left testicle

Measurements: 4.3 x 3.2 x 2.0 cm. No mass or microlithiasis
visualized.

Right epididymis: Mildly enlarged with echogenic and hypoechoic
components with normal vascularity.

Left epididymis: Mildly enlarged with echogenic and hypoechoic
components, including prominent vessels within and surrounding the
epididymis. Otherwise, normal vascularity.

Hydrocele:  Small right hydrocele.

Varicocele:  Left varicocele.

Pulsed Doppler interrogation of both testes demonstrates normal low
resistance arterial and venous waveforms bilaterally.

Other: Diffuse scrotal skin thickening, right greater than left.
There is also and irregular fluid collection in the midline
posterior to the testicles containing diffuse internal echoes. This
has surrounding soft tissue thickening and measures 4.7 x 2.4 x
cm. The surrounding tissue has increased vascularity with color
Doppler.
IMPRESSION: 1. Findings compatible with a recurrent midline scrotal abscess
posteriorly, measuring 4.7 x 2.4 x 2.0 cm.
2. Probable changes due to chronic bilateral epididymitis.
3. Left varicocele.
4. Small right hydrocele.
